# Patient Record
Sex: Female | Born: 1950 | Race: White | Hispanic: No | Marital: Married | Smoking: Never smoker
Health system: Southern US, Community
[De-identification: ages and names within clinical notes are randomized; demographics above are authoritative.]

## PROBLEM LIST (undated history)

## (undated) DIAGNOSIS — I313 Pericardial effusion (noninflammatory): Secondary | ICD-10-CM

## (undated) DIAGNOSIS — I319 Disease of pericardium, unspecified: Secondary | ICD-10-CM

## (undated) DIAGNOSIS — J309 Allergic rhinitis, unspecified: Secondary | ICD-10-CM

## (undated) DIAGNOSIS — M722 Plantar fascial fibromatosis: Secondary | ICD-10-CM

## (undated) DIAGNOSIS — N951 Menopausal and female climacteric states: Secondary | ICD-10-CM

## (undated) DIAGNOSIS — C801 Malignant (primary) neoplasm, unspecified: Secondary | ICD-10-CM

## (undated) DIAGNOSIS — Z923 Personal history of irradiation: Secondary | ICD-10-CM

## (undated) DIAGNOSIS — I3139 Other pericardial effusion (noninflammatory): Secondary | ICD-10-CM

## (undated) DIAGNOSIS — R51 Headache: Secondary | ICD-10-CM

## (undated) DIAGNOSIS — I514 Myocarditis, unspecified: Secondary | ICD-10-CM

## (undated) HISTORY — DX: Myocarditis, unspecified: I51.4

## (undated) HISTORY — PX: ABDOMINAL HYSTERECTOMY: SHX81

## (undated) HISTORY — PX: OTHER SURGICAL HISTORY: SHX169

## (undated) HISTORY — DX: Other pericardial effusion (noninflammatory): I31.39

## (undated) HISTORY — DX: Disease of pericardium, unspecified: I31.9

## (undated) HISTORY — DX: Menopausal and female climacteric states: N95.1

## (undated) HISTORY — DX: Pericardial effusion (noninflammatory): I31.3

## (undated) HISTORY — PX: COLONOSCOPY: SHX174

---

## 1999-05-30 ENCOUNTER — Other Ambulatory Visit: Admission: RE | Admit: 1999-05-30 | Discharge: 1999-05-30 | Payer: Self-pay | Admitting: Obstetrics and Gynecology

## 2001-03-24 ENCOUNTER — Encounter: Admission: RE | Admit: 2001-03-24 | Discharge: 2001-03-24 | Payer: Self-pay | Admitting: Urology

## 2001-03-24 ENCOUNTER — Encounter: Payer: Self-pay | Admitting: Urology

## 2001-03-31 ENCOUNTER — Encounter: Payer: Self-pay | Admitting: Urology

## 2001-03-31 ENCOUNTER — Encounter: Admission: RE | Admit: 2001-03-31 | Discharge: 2001-03-31 | Payer: Self-pay | Admitting: Urology

## 2002-08-25 ENCOUNTER — Other Ambulatory Visit: Admission: RE | Admit: 2002-08-25 | Discharge: 2002-08-25 | Payer: Self-pay | Admitting: Dermatology

## 2002-11-04 ENCOUNTER — Other Ambulatory Visit: Admission: RE | Admit: 2002-11-04 | Discharge: 2002-11-04 | Payer: Self-pay | Admitting: Obstetrics and Gynecology

## 2005-01-20 ENCOUNTER — Encounter: Admission: RE | Admit: 2005-01-20 | Discharge: 2005-01-20 | Payer: Self-pay | Admitting: Gastroenterology

## 2005-01-22 ENCOUNTER — Ambulatory Visit: Payer: Self-pay

## 2005-01-22 ENCOUNTER — Inpatient Hospital Stay (HOSPITAL_COMMUNITY): Admission: AD | Admit: 2005-01-22 | Discharge: 2005-02-01 | Payer: Self-pay | Admitting: Cardiology

## 2005-01-22 ENCOUNTER — Ambulatory Visit: Payer: Self-pay | Admitting: Cardiology

## 2005-01-23 ENCOUNTER — Ambulatory Visit: Payer: Self-pay | Admitting: Cardiology

## 2005-01-23 ENCOUNTER — Encounter (INDEPENDENT_AMBULATORY_CARE_PROVIDER_SITE_OTHER): Payer: Self-pay | Admitting: *Deleted

## 2005-01-24 ENCOUNTER — Encounter: Payer: Self-pay | Admitting: Cardiology

## 2005-01-24 ENCOUNTER — Ambulatory Visit: Payer: Self-pay | Admitting: Pulmonary Disease

## 2005-01-24 ENCOUNTER — Ambulatory Visit: Payer: Self-pay | Admitting: Cardiology

## 2005-01-25 ENCOUNTER — Encounter (INDEPENDENT_AMBULATORY_CARE_PROVIDER_SITE_OTHER): Payer: Self-pay | Admitting: *Deleted

## 2005-01-25 ENCOUNTER — Encounter (INDEPENDENT_AMBULATORY_CARE_PROVIDER_SITE_OTHER): Payer: Self-pay | Admitting: Specialist

## 2005-02-12 ENCOUNTER — Ambulatory Visit: Payer: Self-pay | Admitting: Cardiology

## 2005-02-25 ENCOUNTER — Encounter: Payer: Self-pay | Admitting: Cardiology

## 2005-02-25 ENCOUNTER — Ambulatory Visit: Payer: Self-pay

## 2005-02-25 ENCOUNTER — Ambulatory Visit: Payer: Self-pay | Admitting: Cardiology

## 2005-03-18 ENCOUNTER — Ambulatory Visit: Payer: Self-pay | Admitting: Cardiology

## 2005-05-06 ENCOUNTER — Ambulatory Visit: Payer: Self-pay | Admitting: Cardiology

## 2005-05-20 ENCOUNTER — Encounter: Payer: Self-pay | Admitting: Cardiology

## 2005-05-20 ENCOUNTER — Ambulatory Visit: Payer: Self-pay | Admitting: Cardiology

## 2005-05-20 ENCOUNTER — Inpatient Hospital Stay (HOSPITAL_COMMUNITY): Admission: EM | Admit: 2005-05-20 | Discharge: 2005-05-22 | Payer: Self-pay | Admitting: Emergency Medicine

## 2005-06-12 ENCOUNTER — Ambulatory Visit: Payer: Self-pay

## 2005-06-12 ENCOUNTER — Encounter: Payer: Self-pay | Admitting: Cardiology

## 2005-06-12 ENCOUNTER — Ambulatory Visit: Payer: Self-pay | Admitting: Cardiology

## 2005-07-23 ENCOUNTER — Ambulatory Visit: Payer: Self-pay | Admitting: Cardiology

## 2005-09-22 ENCOUNTER — Ambulatory Visit: Payer: Self-pay | Admitting: Cardiology

## 2005-09-26 ENCOUNTER — Ambulatory Visit: Payer: Self-pay | Admitting: Cardiology

## 2005-09-26 ENCOUNTER — Ambulatory Visit: Payer: Self-pay

## 2006-09-16 ENCOUNTER — Ambulatory Visit: Payer: Self-pay | Admitting: Cardiology

## 2008-01-28 ENCOUNTER — Encounter: Admission: RE | Admit: 2008-01-28 | Discharge: 2008-01-28 | Payer: Self-pay | Admitting: Obstetrics and Gynecology

## 2008-01-31 HISTORY — PX: BREAST BIOPSY: SHX20

## 2008-10-27 DIAGNOSIS — I319 Disease of pericardium, unspecified: Secondary | ICD-10-CM | POA: Insufficient documentation

## 2008-10-27 DIAGNOSIS — N951 Menopausal and female climacteric states: Secondary | ICD-10-CM

## 2008-10-27 DIAGNOSIS — Z79890 Hormone replacement therapy (postmenopausal): Secondary | ICD-10-CM

## 2008-10-27 DIAGNOSIS — I514 Myocarditis, unspecified: Secondary | ICD-10-CM | POA: Insufficient documentation

## 2008-10-30 ENCOUNTER — Ambulatory Visit (HOSPITAL_COMMUNITY): Admission: RE | Admit: 2008-10-30 | Discharge: 2008-10-30 | Payer: Self-pay | Admitting: Family Medicine

## 2008-11-02 ENCOUNTER — Ambulatory Visit: Payer: Self-pay | Admitting: Cardiology

## 2008-11-02 DIAGNOSIS — I300 Acute nonspecific idiopathic pericarditis: Secondary | ICD-10-CM | POA: Insufficient documentation

## 2008-11-02 DIAGNOSIS — R9431 Abnormal electrocardiogram [ECG] [EKG]: Secondary | ICD-10-CM | POA: Insufficient documentation

## 2008-11-13 ENCOUNTER — Encounter: Admission: RE | Admit: 2008-11-13 | Discharge: 2008-11-13 | Payer: Self-pay | Admitting: Obstetrics and Gynecology

## 2010-03-06 ENCOUNTER — Encounter
Admission: RE | Admit: 2010-03-06 | Discharge: 2010-03-06 | Payer: Self-pay | Source: Home / Self Care | Attending: Obstetrics and Gynecology | Admitting: Obstetrics and Gynecology

## 2010-07-30 NOTE — Assessment & Plan Note (Signed)
[facility]                            CARDIOLOGY OFFICE NOTE   NAME:[name], Angel SHADD                        MRN:          [id number]  DATE:09/16/2006                            DOB:          02-17-1951    [name] returns today for followup of her history of recurrent  idiopathic and inflammatory pericarditis.   She is doing remarkably well with no recurrences over the past year! She  had some chest discomfort and some jaw aching last year and we did a  stress Myoview. She did excellent exercising for 9 minutes and 16  seconds. Peak heart rate was 92% of predicted maximum heart rate, met  level achieved was 10.5. She had some general neck pain with the test  and her EF was 67% with no ischemia.   She has had no symptoms of recurrent pericarditis.   MEDICATIONS:  1. Premarin 0.625 mg p.o. daily.  2. Vitamin E daily.   PHYSICAL EXAMINATION:  VITAL SIGNS:  Her blood pressure is 131/82, her  pulse 71 and regular, weight is 131.  HEENT:  Normocephalic, atraumatic. PERRLA. Extraocular movements intact.  Sclera clear. Facial symmetry is normal. Carotids are full. There is no  JVD. Thyroid is not enlarged, trachea is midline.  HEART:  Reveals a nondisplaced PMI. She has normal S1, S2 without rub.  LUNGS:  Clear.  ABDOMEN:  Soft with good bowel sounds.  EXTREMITIES:  Reveal no edema. Pulses are intact.  NEUROLOGIC:  Intact.   Her electrocardiogram  shows normal sinus rhythm, left anterior  fascicular block with poor R wave progression across the anterior  precordium which is old.   ASSESSMENT/PLAN:  Ms. Engebretson is doing well. I am delighted she has had no  recurrence of her pericarditis. She went through a very difficult time.   I will plan on seeing her back in a year.     Thomas C. Daleen Squibb, MD, [facility]  Electronically Signed    TCW/MedQ  DD: 09/16/2006  DT: 09/17/2006  Job #: [id number]

## 2010-08-02 NOTE — H&P (Signed)
NAME:  KYLEIGHA, MARKERT                 ACCOUNT NO.:  [id number]   MEDICAL RECORD NO.:  [id number]          PATIENT TYPE:  INP   LOCATION:  2040                         FACILITY:  [facility]   PHYSICIAN:  Jesse Sans. Wall, M.D.   DATE OF BIRTH:  [birth year]   DATE OF ADMISSION:  01/22/2005  DATE OF DISCHARGE:                                HISTORY & PHYSICAL   REASON FOR ADMISSION:  I was asked by Dr. Ewing Schlein to see Raul Del today who  has cardiomegaly on chest x-ray and some pleural effusion on chest x-ray.   HISTORY OF PRESENT ILLNESS:  The patient is a delightful 60 year old very  healthy married [place] female who has had a 2-week history of progressive  problems with indigestion, difficulty swallowing, early satiety, abdominal  bloating, dry nonproductive cough, and now peripheral edema.   The insidiousness of this coincided with returning from a trip from [place].  She denies any history consistent with a viral syndrome in the last  month or so.  There is no history of TB or unusual infections.  She has had  a mammogram in the past year that was negative  There is no history of  breast cancer in her family.  She has no history of an immune deficiency  disorder or frequent infections.  She had trauma to her chest over a year  ago when she ran inadvertently into a piece of exercise equipment walking in  the dark in her bedroom.   She denies any fever, but has had a few chills.  She has had no night  sweats.  She denies any hemoptysis or change in bowel habits.  Her weight  has increased significantly because of fluid retention.  Prior to this, she  felt just fine.   PAST MEDICAL HISTORY:   ALLERGIES:  She is intolerant to PENICILLIN.  She has had no history of dye  reaction.   CURRENT MEDICATIONS:  1.  HCTZ 12.5 mg daily, which she was just put on yesterday by her primary      care physician for fluid.  2.  Premarin 0.625 mg p.o. daily.   SOCIAL HISTORY:  She drinks 1-2 cups of  coffee a day.  She does not smoke or  drink.  She has been extremely healthy.  She is very active.  She has had a  hysterectomy in 1991.  She is married.  She has 1 child.  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   FAMILY HISTORY:  Significant for father having an MI at age 67.  Sister at  age 59 had a stent.  She does not know her lipid status.   REVIEW OF SYSTEMS:  Other than in the HPI, significant for constipation here  recently.   PHYSICAL EXAMINATION:  VITAL SIGNS:  She is 5 feet, 3 inches.  She weighs  132 pounds.  She usually weighs 118.  Her blood pressure is 120/86, pulse 90  and regular.  She does not have palpable pulsus paradox.  Her feet are cool,  but pulses are intact.  Her pulsus paradox measures about 10-12 in the left  arm on several checks.  HEENT:  Sclerae are clear.  She is normocephalic and atraumatic.  Pupils  equal, round and responsive to light and accommodation.  Extraocular  movements are intact.  Dentition is normal.  NECK:  Carotid upstrokes are equal bilaterally without bruits.  There is  JVD.  She has an HJR.  I could not visualize any Kussmaul's sign.  Her  thyroid is not enlarged.  Trachea is midline.  CHEST:  Chest wall is unremarkable.  BREAST EXAM:  Not done.  HEART:  PMI was poorly appreciated.  She has a normal S1 and S2 without  gallop, rub, or pericardial knock.  LUNGS:  Remarkable for decreased breath sounds in the left base.  ABDOMEN:  Slightly distended with good bowel sounds.  Liver edge is 2  fingerbreadths below the right costal margin and slightly tender.  EXTREMITIES:  No cyanosis or clubbing, but there is 1+ to 2+ edema.  She  does have some dependent rubor, and her feet and toes were slightly cool.  NEUROLOGIC:  Intact.   ELECTROCARDIOGRAM:  Electrocardiogram shows sinus rhythm with low voltage  and minimal electrical alternans.  It is difficult to see because of low  voltage.   CHEST X-RAY:  A chest x-ray was obtained in  Dr. Marlane Hatcher office.  I do not  have a copy of that.   LABORATORY DATA:  Pending.   ECHOCARDIOGRAM:  Reveals a very large global pericardial effusion that is at  least 2 cm anteriorly and 3 cm at the apex.  There is some right ventricular  collapse.  There are some respirophasic changes that are difficult to  assess.  Left ventricular function and size were normal.  EF was 55% to 65%.  Inferior vena cava was mildly dilated.  There was no pertinent or  significant valve disease.   ASSESSMENT:  Subacute pericarditis with a large pericardial effusion.  The  most likely etiology is idiopathic, though there is no obvious viral  prodrome or syndrome.  Other causes of the very large effusions include  malignancy, particularly breast in women, though she had a negative  mammogram within the past year.  Other causes include tuberculosis, but  there is no history of this.   PLAN:  1.  Admit to telemetry.  2.  Schedule for pericardial centesis with either echo guidance or in the      catheterization lab tomorrow morning.  I would not proceed to a window      unless she has positive cytologies and/or if there is recurrence of      perfusion.  If she has a bloody pericardial effusion, then I would      proceed with a window, as well.  Hopefully, we can manage this      conservatively.  Fluid should be sent off for cytology, as well as all      cultures including tuberculosis and atypical infections.  3.  IV fluids at 125 an hour.  4.  Alert physician if her heart rate is greater than 110 or her blood      pressure decreases below 100 this evening.  5.  Sedimentation rate, CBC, comprehensive metabolic panel, TSH.  6.  PPD and control.  7.  Tussionex for cough which she is very uncomfortable with.  8.  Acquire chest x-ray.  9.  Chest CT to evaluate pericardium for any thickening mass or lung  pathology.     Thomas C. Wall, M.D.  Electronically Signed     TCW/MEDQ  D:  01/22/2005  T:   01/22/2005  Job:  [id number]   cc:   Petra Kuba, M.D.  Fax: [phone]   [facility]  [place], [place]

## 2010-08-02 NOTE — Consult Note (Signed)
[name], [name]                 ACCOUNT NO.:  [id number]   MEDICAL RECORD NO.:  [id number]          PATIENT TYPE:  INP   LOCATION:  2927                         FACILITY:  [facility]   PHYSICIAN:  Coralyn Helling, M.D.      DATE OF BIRTH:  03/22/1950   DATE OF CONSULTATION:  01/24/2005  DATE OF DISCHARGE:                                   CONSULTATION   REFERRING PHYSICIAN:  Willa Rough, M.D.   INDICATION FOR CONSULTATION:  Pleural effusion.   This is a very pleasant 60 year old female who was admitted January 22, 2005, with a pericardial effusion.  She said that she had not been feeling  well for the last several weeks.  She said this coincided with a recent trip  to [place] and upon returning from that, she started to notice her  symptoms.  She said that she would have a feeling like she could not expand  her lungs completely as well a feeling of nauseousness and feeling short of  breath.  She also had some vague abdominal pain.  She was seen by  gastroenterology for this, and there was concern that she had gallbladder  disease.  She had undergone an ultrasound of her abdomen January 20, 2005,  and this showed bilateral pleural effusions, a simple-appearing right  hepatic cyst but otherwise essentially normal upper abdominal ultrasound.  She had then undergone a chest x-ray on January 20, 2005, and this showed  marked cardiac enlargement with bilateral pleural effusions with bibasilar  atelectasis.  A CT of the chest January 22, 2005, showed a very large  pericardial effusion and bilateral pleural effusions, which was larger on  the right than the left, and dependent pulmonary atelectasis.  She  subsequently underwent right heart catheterization and pericardiocentesis,  and it demonstrated increased right atrial pressures prior to fluid drainage  and then she had approximately 1400 mL of bloody fluid removed from her  pericardial sac.  She said since then she has felt  symptomatically better,  but she still has difficulty as far as feeling like she cannot take a deep  breath.  She denies having any recent fevers.  There is no history of  hepatitis or jaundice.  She does not have any history of renal disease.  She  has not lost any weight recently.  She said that she did have an episode of  sweating last night but otherwise nothing from before.  She works as a  Runner, broadcasting/film/video and says she has sick contacts with the people she works with but  otherwise no exposure to tuberculosis.  She denies any symptoms of coughing,  sputum production or hemoptysis.  She also denies any skin rashes or joint  pain or stiffness.  She says that she did have some abdominal bloating as  well as leg swelling, but this seems to have improved.   PAST MEDICAL HISTORY:  Significant for endometriosis, and she is status post  abdominal hysterectomy with bilateral salpingo-oophorectomy for this.   MEDICATIONS:  1.  Premarin 0.625 mg daily.  2.  She was  recently started on hydrochlorothiazide.  3.  She takes vitamin E.   ALLERGIES:  She has an intolerance to penicillin.   SOCIAL HISTORY:  She drinks one to two cups of coffee a day.  Has no history  of smoking or alcohol use.  She is married and has one child.  She works as  a Librarian, academic in eighth grade.   FAMILY HISTORY:  Significant for father, who had an MI.  Sister had what  sounds like a congenital cardiac anomaly.   REVIEW OF SYSTEMS:  As stated above.   PHYSICAL EXAMINATION:  GENERAL:  She is a very pleasant-appearing female.  VITAL SIGNS:  Temperature 97.7, blood pressure is 100/75, heart rate is 100,  respiratory rate is 20, oxygenation is 96% on room air.  HEENT:  Pupils equal and reactive.  Extraocular muscles intact.  There is no  scleral icterus.  No sinus tenderness.  No oral lesions.  NECK:  There is no lymphadenopathy, no thyromegaly.  CARDIAC:  Decreased heart sounds, S1, S2.  CHEST:  Decreased breath  sounds at the bases with bilateral dullness to  percussion and rales midway up.  There is no wheezing.  ABDOMEN:  Soft, nontender, positive bowel sounds.  EXTREMITIES:  There is mild ankle edema, otherwise no cyanosis or clubbing.  NEUROLOGIC:  Alert and oriented x3.  5/5 strength.  No cerebellar deficits  noted.   LABORATORY DATA:  CBC showed WBC of 7.4, hemoglobin 16.5, hematocrit 46.1,  platelets of 330.  Sodium is 138, potassium is 3.4, chloride is 103, CO2 is  29, BUN is 4, creatinine 0.9, glucose is 134.  AST is 50, ALT is 53, ALP 65,  bilirubin is 2, albumin is 3.2, calcium is 8.5.  Pericardial fluid protein  is 4.9, the glucose is less than 20, creatinine is less than 0.7.  Amylase  was 42.  LDH was 918.  The pericardial fluid showed a bloody appearance with  3300 Legate cells, 19 neutrophils, 38 lymphocytes and 43 macrophages and no  eosinophils, and there were many pigment-laden macrophages and many  mesothelial cells were present as well.  The erythrocyte sedimentation rate  was 8.  TSH was 2.14.   IMPRESSION:  A 60 year old female with pericardial effusion and bilateral  pleural effusions and possible leg swelling. At this point I would like to  wait for the results of the pericardial fluid cytology.  I doubt that this  is an acute infectious etiology.  She has had a PPD placed in her right  forearm yesterday, and we will follow up the results of this although this  appears to be negative so far.  She does have a mild increase in her liver  enzymes, but this may be secondary to passive congestion from her  pericardial effusion.  Will repeat the liver enzymes.  I would also like to  check a C-reactive protein, ANA and rheumatoid factor as well as a CEA, CA19-  19 and CA125, and then depending upon the results of these and the  pericardial fluid results, I will determine the need for thoracentesis for further evaluation, but we will follow along with you.   As always, thank  you very much for allowing [place] to share in the care of your  patient.      Coralyn Helling, M.D.  Electronically Signed     VS/MEDQ  D:  01/24/2005  T:  01/25/2005  Job:  [id number]

## 2010-08-02 NOTE — Consult Note (Signed)
NAME:  [name], [name]                 ACCOUNT NO.:  [id number]   MEDICAL RECORD NO.:  [id number]          PATIENT TYPE:  INP   LOCATION:  2927                         FACILITY:  [facility]   PHYSICIAN:  Evelene Croon, M.D.     DATE OF BIRTH:  Nov 24, 1950   DATE OF CONSULTATION:  01/24/2005  DATE OF DISCHARGE:                                   CONSULTATION   CARDIOVASCULAR SURGICAL CONSULTATION   REASON FOR CONSULTATION:  Recurrent pericardial effusion.   HISTORY OF PRESENT ILLNESS:  The patient is a 60 year old previously healthy  woman who was admitted on 01/22/2005 with a 2-week history of progressive  complaints of indigestion, difficulty swallowing, early satiety, abdominal  bloating, nonproductive cough, shortness of breath, and peripheral edema.  She had an echocardiogram performed which showed a very large pericardial  effusion with signs of tamponade. Ejection fraction was 55-65%. On 11/09 she  underwent pericardiocentesis by Dr. Juanda Chance who removed 1400 mL of bloody  pericardial fluid. Right atrial pressure went from 24-12 and wedge pressure  went from 24-14. The catheter was not left in the pericardium. She noticed  improvement in her symptoms, but continued to have a nonproductive cough.  The cultures of this fluid are preliminarily negative. The fluid showed  bloody fluid with a Xiong blood cell count 33,000 with pigmented macrophage  and mesoepithelial cells.  Cytology is reportedly negative. A repeat  echocardiogram was performed today which showed partial reaccumulation of  the fluid with signs of early collapse of the right ventricle. I was  consulted by Dr. Gala Romney to evaluate the patient for pericardial window.   PAST MEDICAL HISTORY:  Significant for hysterectomy and 1991. She denies any  other medical illnesses.   SOCIAL HISTORY:  She is a nonsmoker; denies alcohol abuse. She is very  active. 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   MEDICATIONS PRIOR TO ADMISSION:  1.  HCTZ 12.5 mg q.d. which was just started due to edema.  2.  She is on Premarin 0.625 mg daily.   ALLERGIES:  PENICILLIN.   REVIEW OF SYSTEMS:  Her review of systems is as follows:  GENERAL:  She  denies fever or chills. She denies any night sweats. She has had recent  fatigue. EYES:  Negative. ENT:  Negative.  ENDOCRINE:  She denies diabetes  and hypothyroidism. CARDIOVASCULAR:  She had no chest pain until the  pericardial drainage procedure; and then she had some chest pain yesterday.  She has had recent exertional dyspnea over the past 2 weeks. She has had  some orthopnea. She has had mild peripheral edema. She denies palpitations.  RESPIRATORY:  She has had a dry nonproductive cough over the past 2 weeks.  She denies any upper respiratory infection. There is no history of TB.  GI:  She denies nausea or vomiting. She has had a indigestion; feeling like she  could not swallow her food and early satiety. She has had some abdominal  bloating over the past 2 weeks. She has had constipation. GU:  She  denies  dysuria and hematuria. PSYCHIATRIC:  Negative.   FAMILY HISTORY:  Her father had an MI at age 50; and her sister had a stent  at age 67.   PHYSICAL EXAMINATION:  VITAL SIGNS:  Blood pressure is 110/75 and her pulse  is monitored at 15 and regular. Respiratory rate is 60 and unlabored.  Oxygen saturations is 92% on room air.  She is a well-developed, [place]  female in no distress.  HEENT EXAM:  Shows her to be normocephalic and atraumatic. Pupils are equal  and reactive to light and accommodation. Extraocular muscles are intact. The  throat is clear.  NECK EXAM:  Shows normal carotid pulses bilaterally. There is a mild JVD.  There is no adenopathy or thyromegaly. Carotid pulses are palpable  bilaterally. There are no bruits.  CARDIAC EXAM:  Shows a regular rate and rhythm with distant heart sounds.  There is no murmur.  LUNGS:   Revealed decreased breath sounds in the lower lobes.  ABDOMINAL EXAM:  Shows active bowel sounds. The abdomen is soft and  nontender. There are no palpable masses or organomegaly.  EXTREMITY EXAM:  Shows no peripheral edema. Pedal pulses are palpable  bilaterally.  SKIN:  Warm and dry.  NEUROLOGIC EXAM:  Shows her to be alert and oriented x3. Motor and sensory  exams are grossly normal.   IMPRESSION:  Ms. Burgen has recurrence of pericardial effusion after  pericardicentesis and drainage of 14 mL of bloody pericardial fluid. She has  bilateral pleural effusions on CT scan. I agree that proceeding with  pericardial window to completely drain the pericardial effusion is the best  treatment to prevent recurrence; and we will leave a tube in for several  days. I will review her CT scan of the chest to decide whether a drainage of  the pericardial effusion should be done at the same time. I discussed the  operative procedure with her including alternatives, benefits, and risks,  including:  Bleeding, blood transfusion, infection, stroke, myocardial  infarction, and death. She understands and would like proceed with surgery.  We will plan to do this in the morning, 01/25/2005.      Evelene Croon, M.D.  Electronically Signed     BB/MEDQ  D:  01/24/2005  T:  01/26/2005  Job:  [id number]

## 2010-08-02 NOTE — H&P (Signed)
NAME:  [name], [name]                 ACCOUNT NO.:  [id number]   MEDICAL RECORD NO.:  [id number]          PATIENT TYPE:  INP   LOCATION:  1843                         FACILITY:  [facility]   PHYSICIAN:  Olga Millers, M.D. LHCDATE OF BIRTH:  05-Oct-1950   DATE OF ADMISSION:  05/20/2005  DATE OF DISCHARGE:                                HISTORY & PHYSICAL   PRIMARY CARDIOLOGIST:  Dr. Valera Castle   She does not have a primary care physician.   PATIENT PROFILE:  60 year old [place] female with prior history of  pericarditis, mild myocarditis, and status post pericardial window who  presents today with worsening chest pain.   PROBLEM LIST:  1.  Pericarditis.  2.  Had a history of pericardial effusion.      1.  Status post pericardial window January 25, 2005 by Dr. Laneta Simmers.      2.  February 25, 2005 repeat echocardiogram showing moderate loculated          fibrin-stranded pericardial effusion anterior to the heart.  There          is also a left pleural effusion.  Ejection fraction was 55-65%.  3.  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  4.  Perimenopausal.  5.  Status post hysterectomy 1991.   HISTORY OF PRESENT ILLNESS:  60 year old [name] female with history of  pericarditis in November 2007 with significant dyspnea, edema, and  echocardiogram revealing large pericardial effusion measuring 3 cm at the  apex and 2.5 cm anteriorly.  Dr. Laneta Simmers performed pericardial window and she  was discharged February 01, 2005.  All cultures and cytology were negative.  Since discharge she has continued to have pericarditic pain with  echocardiogram in December of 2006 showing loculated fibrin-stranded  moderate anterior pericardial effusion despite Naproxen therapy prompting  steroid taper for five weeks between December of 2006 and January of 2006.  She always did well on steroids but when the taper got down to the 5 mg dose  she would have breakthrough pain.  She was seen by Dr. Daleen Squibb on February  20  with additional complaints of pain and was placed on colchicine.  Since  March 3 she has had worse chest pain than normal with nearly every breath or  with lying flat and relieved by sitting forward.  She has also had chest  pain with exertion.  She denies any PND, orthopnea, dizziness, syncope,  edema, or early satiety.  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   ALLERGIES:  PENICILLIN.   MEDICATIONS:  1.  Toprol XL 50 mg b.i.d.  2.  Colchicine 0.6 mg b.i.d. started on May 06, 2005.   FAMILY HISTORY:  Mother is alive and well at age 26.  Father died at 3 of  an MI.  He also had an MI at age 17.  She has a sister who has a history of  CAD and was stented at age 69.   SOCIAL HISTORY:  She lives in [place] with her husband.  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  She is married and has one child.  She denies any  tobacco, alcohol, or drug use.  She does not routinely exercise.   REVIEW OF SYSTEMS:  Positive for nocturnal sweats, chest pain, some dyspnea  on exertion.  All other systems reviewed and negative.   PHYSICAL EXAMINATION:  VITAL SIGNS:  Temperature 98.3, heart rate 104,  respirations 24, blood pressure 125/66.  GENERAL:  Pleasant [place] female in no acute distress.  Awake, alert, and  oriented x3.  NECK:  Normal carotid upstrokes.  No bruits, JVD.  LUNGS:  Respirations regular, unlabored.  Clear to auscultation.  CARDIAC:  Regular S1, S2.  She does have a gallop.  There are no murmurs or  rub.  ABDOMEN:  Round, soft, nontender, nondistended.  Bowel sounds present x4.  EXTREMITIES:  Warm, dry, pink.  No clubbing, cyanosis, edema.  Dorsalis  pedis, posterior tibial pulses 2+ and equal bilaterally.   Chest x-ray is pending.  EKG shows a rate of 105 in sinus tachycardia with a  left axis and no ST-T changes.  All laboratory work is pending.    ASSESSMENT/PLAN:  Subacute pericarditis.  Patient has had difficulty with  chest pain since discharge in November 2006.  Will check an echocardiogram  here in the emergency department to rule out pericardial effusion/tamponade.  Vital signs stable and I do not appreciate a rub, although she does have a  gallop.  Her previous evaluation included pericardiocentesis revealing no  malignancy or infection and findings were consistent with  pericarditis/inflammation.  We will plan to admit her and follow with  echocardiogram results.  Will initiate a steroid taper as well as Naproxen  and provide gastrointestinal prophylaxis.  Will continue her colchicine.  Will also evaluate cardiac markers to rule out myocardial infarction.      Ok Anis, NP    ______________________________  Olga Millers, M.D. [facility]    CRB/MEDQ  D:  05/20/2005  T:  05/20/2005  Job:  [id number]

## 2010-08-02 NOTE — Discharge Summary (Signed)
NAME:  [name], [name]                 ACCOUNT NO.:  [id number]   MEDICAL RECORD NO.:  [id number]          PATIENT TYPE:  INP   LOCATION:  2016                         FACILITY:  [facility]   PHYSICIAN:  Jesse Sans. Wall, M.D.   DATE OF BIRTH:  21-Mar-1950   DATE OF ADMISSION:  01/22/2005  DATE OF DISCHARGE:  02/01/2005                                 DISCHARGE SUMMARY   PRIMARY CARDIOLOGIST:  Maisie Fus C. Wall, M.D.   PRINCIPAL DIAGNOSIS:  Subcu pericarditis with large pericardial effusion.   OTHER DIAGNOSES:  Premenopausal.   ALLERGIES:  PENICILLIN.   PROCEDURES:  Pericardiocentesis with placement of window.   HISTORY OF PRESENT ILLNESS:  A 60 year old married [place] female who, 2 weeks  prior to admission, began to experience progressive problems with  indigestion, difficulty swallowing, early satiety, abdominal bloating, dry  nonproductive cough, and peripheral edema. She saw her primary care  physician the day prior to admission and was  initiated on HCTZ for lower extremity edema. Secondary to worsening dyspnea  presented to the ER on January 22, 2005. Chest x-ray showed cardiomegaly  with some pleural effusion. She was admitted to cardiology for further  evaluation.   HOSPITAL COURSE:  Echocardiogram was performed revealing a large global  pericardial effusion measuring 3 cm at the apex and 2.5 cm anteriorly. She  did not demonstrate tamponade physiology and had normal LV function. She did  have RV collapse. LV function was normal. A right heart cardiac  catheterization and subxiphoid pericardial tap was performed on November 9.  Initially RA was 24, down to 12 following tap. Her wedge was 24, down to 14  following tap. They were able to draw 1400 mL of bloody fluid. On November  10 a repeat echocardiogram revealed recurrent effusion and CVTS was  consulted. She underwent placement of pericardial window on November 11 with  300 mL of serosanguineous fluid removed and sent for cytology.  Cytology was  negative for tumor and cultures were negative. She also had a PPD which was  negative x48 hours. Pericardial biopsy revealed acute inflammation  consistent with pericarditis without features of malignancy seen. Her chest  tube was discontinued and she was initiated on NSAIDs. She was transferred  out to telemetry and clinically was much improved. She was discharged home  on Naprosyn on November 18 in satisfactory condition.   DISCHARGE LABS:  Hemoglobin 13.0, hematocrit 38.6, WBCs 6.5, platelets 346.  Sodium 135, potassium 5.1, chloride 109, CO2 19, BUN 6, creatinine 0.8,  glucose 115, calcium 7.6. Total bilirubin 1.1, alkaline phosphatase 56, AST  24, ALT 28. CK 332, MB 6.3, troponin I of 0.06, TSH of 2.141, CEA less than  0.05. CA-125 antigen 267.2. CA19-9 at 9.8. High sensitivity CRP 98.5.   DISPOSITION:  The patient was discharged home in good condition.   FOLLOW UP APPOINTMENT:  She was asked to follow up with Dr. Valera Castle in  approximately 2 weeks following discharge.   DISCHARGE MEDICATIONS:  1.  Naproxen 500 mg b.i.d. x10 days.  2.  Protonix 40 mg daily x10 day.  3.  Toprol-XL 50 mg b.i.d.   __________. Duration of discharge __________ 30 minutes.      Ok Anis, NP      Jesse Sans. Wall, M.D.  Electronically Signed    CRB/MEDQ  D:  04/24/2005  T:  04/25/2005  Job:  [id number]   cc:   Thomas C. Wall, M.D.  [address]  [address]  [place]  [place] [address]

## 2010-08-02 NOTE — Op Note (Signed)
[name], [name]                 ACCOUNT NO.:  [id number]   MEDICAL RECORD NO.:  [id number]          PATIENT TYPE:  INP   LOCATION:  2927                         FACILITY:  [facility]   PHYSICIAN:  Evelene Croon, M.D.     DATE OF BIRTH:  11-Feb-1951   DATE OF PROCEDURE:  01/25/2005  DATE OF DISCHARGE:                                 OPERATIVE REPORT   PREOPERATIVE DIAGNOSES:  1.  Recurrent large pericardial effusion; and,  2.  Bilateral pleural effusion.   POSTOPERATIVE DIAGNOSES:  1.  Recurrent large pericardial effusion; and,  2.  Bilateral pleural effusion.   OPERATION PERFORMED:  1.  Subxiphoid pericardial window with drainage of pericardial effusion.  2.  Insertion of bilateral pleural chest tubes with drainage of pleural      effusion,   SURGEON:  Evelene Croon, M.D.   ANESTHESIA:  General endotracheal with 0.25% Marcaine local anesthesia.   CLINICAL HISTORY:  This patient is a 60 year old previously healthy woman  who was admitted on the cardiology service on January 22, 2005 with a large  pericardial effusion with tamponade and bilateral pleural effusion.  She had  a pericardiocentesis performed by Dr. Juanda Chance on January 23, 2005 and 1,400  mL of bloody pericardial was withdrawn.  As of this time cultures have been  negative and cytology was negative.  She had a repeat echocardiogram, which  showed partial reaccumulation of the pericardial effusion, with early  collapse of the right ventricle.  She remained hemodynamically stable,but  had some resting tachycardia.  She continued to have some shortness of  breath and cough related to the pleural effusions.  It was felt that a  pericardial window would be the best treatment for complete drainage of the  pericardial effusion as well as pericardial biopsy.  I also felt that the  drainage of both pleural effusions old be performed.   I discussed the operative procedure of subxiphoid pericardial window and  bilateral chest  tube insertion with the patient and her family including  alternatives, benefits and risks including bleeding, injury to the heart or  lungs, and recurrence of the pleural or pericardial effusions.  She  understood and agreed to proceed.   DESCRIPTION OF OPERATION:  The patient was brought to the operating room and  placed on the table in the supine position.  After the induction of general  endotracheal anesthesia a Foley catheter was placed in the bladder using a  sterile technique.  Then the chest was prepped with Betadine  soap  and  solution, and draped in the usual sterile manner.  Preoperative intravenous  Avelox was given.   Then a vertical midline incision was made over the xiphoid process and  carried down through the subcutaneous tissues using electrocautery.  The  fascia was divided in the midline using electrocautery.  The subxiphoid  space was entered.  The pericardium was identified and an opening made.  Three-hundred milliliters of serosanguineous pericardial fluid was withdrawn  and sent for cytology.  A window was made in the pericardium and the  specimen sent for pathological examination.  Examination of the heart through the small window showed some shaggy  exudates on the heart.  Then 28  French right-angle chest tube was brought  through a separate stab incision and positioned in the posterior  pericardium.   The fascia was anesthetized with a quarter percent Marcaine local  anesthesia.  The fascia was then reapproximated with interrupted 0-Vicryl  sutures, subcutaneous tissue was closed with continuous 2-0 Vicryl and the  skin with 3-0 Vicryl subcuticular closure.   COUNTS:  The sponge, needle and instrument counts were correct according to  the scrub nurse.   DRAINS:  Then 28 French chest tubs were inserted in both pleural spaces  through a small incision in the anterior axillary line.  Approximately 1,000  mL of yellow serous pleural fluid was removed from  the right pleural space  and sent for cytology.  About 500 mL of left pleural fluid was removed and  this was also yellow serous fluid.  These chest tubes were connected to  Pleur-Evac suction,   The patient remained hemodynamically stable. She was awakened, extubated and  transported to the post-anesthesia care unit in satisfactory and stable  condition.      Evelene Croon, M.D.  Electronically Signed     BB/MEDQ  D:  01/25/2005  T:  01/26/2005  Job:  [id number]   cc:   [facility]

## 2010-08-02 NOTE — Discharge Summary (Signed)
NAME:  [name], [name]                 ACCOUNT NO.:  [id number]   MEDICAL RECORD NO.:  [id number]          PATIENT TYPE:  INP   LOCATION:  2038                         FACILITY:  [facility]   PHYSICIAN:  Cecil Cranker, M.D.DATE OF BIRTH:  02-12-1951   DATE OF ADMISSION:  05/20/2005  DATE OF DISCHARGE:  05/22/2005                                 DISCHARGE SUMMARY   CARDIOLOGIST:  Maisie Fus C. Wall, M.D.   DISCHARGING CARDIOLOGIST:  Cecil Cranker, M.D.   PRIMARY CARE PHYSICIAN:  The patient does not have a primary care physician.   PRINCIPAL DIAGNOSES:  1.  Pericarditis.  2.  History of pericardial effusion.      1.  Status post pericardial window on January 25, 2005, by Dr. Laneta Simmers.      2.  On February 25, 2005, repeat echocardiogram showing moderate          loculated fibrin-stranded pericardial effusion anterior to his          heart. There is also a left pleural effusion. Ejection fraction was          55% to 65%.   SECONDARY DIAGNOSES:  1.  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  2.  Perimenopausal.  3.  Status post hysterectomy in 1991.   PROCEDURE PERFORMED DURING THIS HOSPITALIZATION:  A 2-D echo on May 20, 2005.   ALLERGIES TO MEDICATION:  PENICILLIN.   HISTORY OF PRESENT ILLNESS:  This is a 60 year old Caucasian female with a  history of pericarditis in November 2007 who had presented with significant  dyspnea, edema, and an echocardiogram revealing a large pericardial effusion  measuring 3 cm of the apex and 2.5 cm anteriorly. Previously Dr. Laneta Simmers had  performed a pericardial window and she was discharged on February 01, 2005.  Since her discharge she had continued to have pericardiac pain and an  echocardiogram in December 2006 showed a loculated fibrin-stranded, moderate  anterior pericardial effusion despite naproxen therapy. The patient was  started on a steroid taper for five weeks between December 2006 and January  2007. She did very well on  steroids, but when her pain got down to 5 mg she  would start to have breakthrough pain. She was seen in the office on  February 20th with additional complaints of pain and was placed on  colchicine. The patient was starting to complain of worse chest pain with  every breath and sitting forward. The patient came in through the ED and was  admitted for evaluation of this chest pain. The patient was admitted on  May 20, 2005, and 2-D echo was performed which showed left ventricular  systolic function normal. No ventricular regional wall abnormalities. There  was mild to moderate right ventricular hypertrophy, estimated peak right  ventricular systolic pressure was within the upper limits of normal. There  was a very small pericardial effusion predominately posterior to the heart.  The patient was seen by Dr. Corinda Gubler on May 22, 2005. The patient was  feeling well and Dr. Corinda Gubler stated the patient was stable to be discharged  home.  DISCHARGE DIAGNOSIS:  Pericarditis which was improving with a small  effusion.   DISCHARGE MEDICATIONS:  The patient will be sent home on the following  medications:  1.  Metoprolol succinate 50 mg b.i.d.  2.  Colchicine 0.6 mg b.i.d.  3.  Naproxen 500 mg b.i.d.  4.  Protonix 40 mg daily.  5.  Prednisone taper. The patient had completed 40 mg for three days, then      30 mg for three days, then 20 mg for two days, and then 10 mg for two      days.   The patient will follow up with Dr. Daleen Squibb on March 29th at 2:15. She will  have an echo at 1:00 prior to her visit. She was sent home on a heart  healthy diet.   Her discharge laboratories showed a Azure blood cell count of 6.2,  hemoglobin 10.9, hematocrit 32.1, platelet count 266,000. INR of 1.0. Sodium  135, potassium 3.6, chloride 103, glucose 88, BUN 7, and creatinine of 0.8.  CK-MBs were both 0.2 times two and BNP was 63.1.     ______________________________  April Humphrey, NP     ______________________________  E. Graceann Congress, M.D.    AH/MEDQ  D:  05/22/2005  T:  05/22/2005  Job:  [id number]

## 2010-08-02 NOTE — Cardiovascular Report (Signed)
NAME:  [name], [name]                 ACCOUNT NO.:  [id number]   MEDICAL RECORD NO.:  [id number]          PATIENT TYPE:  INP   LOCATION:  2040                         FACILITY:  [facility]   PHYSICIAN:  Charlies Constable, M.D. [facility] DATE OF BIRTH:  10-Feb-1951   DATE OF PROCEDURE:  01/23/2005  DATE OF DISCHARGE:                              CARDIAC CATHETERIZATION   PROCEDURE:  Right heart catheterization and pericardiocentesis.   CLINICAL HISTORY:  [name] is 60 years old and has no prior history of  known heart disease.  She has had symptoms of shortness of breath and  fatigue and some chest discomfort for about three weeks and was being  evaluated for potential GI problems and was referred by Dr. Ewing Schlein to Dr.  Daleen Squibb.  An echocardiogram was performed, which showed a large pericardial  effusion.  A decision was made to drain the effusion with a catheter today.   PROCEDURE:  Right heart catheterization was performed via the right femoral  vein using a mini-sheath and Swan-Ganz thermodilution catheter.  The  subxiphoid area was prepped and draped.  We used an echo under sterile  conditions to document the access via the subxiphoid approach to the  pericardial effusion.  We documented that an angle just to the left side of  the neck would be optimal.  The skin and subcutaneous tissue were  anesthetized with 1% local Xylocaine.  Using an 18-gauge needle and ECG  guidance, we passed the needle into the pericardial space.  We aspirated  bloody fluid.  We then passed a wire into the pericardial space and  documented that the position of the wire appeared to be in the pericardium  rather than intracardiac.  We then used a 5 [place] dilator to dilate the  tract into the pericardium and then followed this with a pericardial  catheter.  We hooked the catheter to pressure and documented that this was a  pericardial pressure.  We then drained about 1400 mL of bloody fluid from  the pericardium.  She was mildly  short of breath at the beginning of the  procedure, and these symptoms improved after drainage.  The pericardial  catheter was removed at the end of the catheter.  The right heart  catheterization was pulled back and removed at the end of the procedure.  The patient tolerated the procedure well and left the laboratory in  satisfactory condition.   RESULTS:  Initially the pericardial pressure was 24 and following  pericardiocentesis, this fell to 0.  The right atrial pressure was 24 and  fell to 12.  Pulmonary artery pressure was 38/21 with a mean of 30.  Pulmonary wedge pressure was initially 24 and fell to 14.   CONCLUSION:  Pericardiocentesis with drainage of 1400 mL of bloody fluid  with a fall in the right atrial pressure from 24 to 12 and a fall in the  pericardial pressure from 24 to 0.   DISPOSITION:  The patient returned to coronary intensive care unit.  All the  fluid was sent for cytology, cultures and chemistries.  ______________________________  Charlies Constable, M.D. [facility]     BB/MEDQ  D:  01/23/2005  T:  01/24/2005  Job:  [id number]   cc:   Thomas C. Wall, M.D.  [address]  [address]  [place]  [place] [address]   Willa Rough, M.D.  [address]  [address]  [place]  [place] [address]   Cardiopulmonary Lab

## 2010-12-09 ENCOUNTER — Encounter: Payer: Self-pay | Admitting: Cardiology

## 2010-12-10 ENCOUNTER — Encounter: Payer: Self-pay | Admitting: Cardiology

## 2010-12-10 ENCOUNTER — Ambulatory Visit (INDEPENDENT_AMBULATORY_CARE_PROVIDER_SITE_OTHER): Payer: BC Managed Care – PPO | Admitting: Cardiology

## 2010-12-10 VITALS — BP 115/80 | HR 70 | Ht 62.0 in | Wt 128.0 lb

## 2010-12-10 DIAGNOSIS — I318 Other specified diseases of pericardium: Secondary | ICD-10-CM

## 2010-12-10 DIAGNOSIS — I319 Disease of pericardium, unspecified: Secondary | ICD-10-CM | POA: Insufficient documentation

## 2010-12-10 DIAGNOSIS — Z8679 Personal history of other diseases of the circulatory system: Secondary | ICD-10-CM | POA: Insufficient documentation

## 2010-12-10 MED ORDER — PREDNISONE 10 MG PO TABS: 10.0000 mg | ORAL_TABLET | ORAL | Status: DC | PRN

## 2010-12-10 NOTE — Progress Notes (Signed)
HPI [name] returns today for her history of idiopathic pericarditis and chronic recurrent pericarditis. She's not had an episode since this past year. 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 She denies any chest pain, angina, dyspnea on exertion, or palpitations. He does have some lower extremity edema hydrochlorothiazide keeps this in check.  She likely new on a prednisone pack.  EKG shows normal sinus rhythm left axis deviation no ST segment changes. Past Medical History  Diagnosis Date  . Perimenopausal   . Myocarditis, unspecified   . Unspecified disease of pericardium   . Pericardial effusion   . Pericarditis     Past Surgical History  Procedure Date  . Pericardial window on november 11,2006, by dr bartle   . Other surgical history     post hysterectomy 1991    Family History  Problem Relation Age of Onset  . Coronary artery disease Sister     stented at age 31    History   Social History  . Marital Status: Married    Spouse Name: N/A    Number of Children: N/A  . Years of Education: N/A   Occupational History  . Not on file.   Social History Main Topics  . Smoking status: Not on file  . Smokeless tobacco: Not on file  . Alcohol Use:   . Drug Use:   . Sexually Active:    Other Topics Concern  . Not on file   Social History Narrative   She lives in [place] with her husband. She teaches 8 th grade social studies .She is married and has one child. She denies any  Tobacco, alcohol, or drug use. She does not routinely exercise    Allergies  Allergen Reactions  . Penicillins     Current Outpatient Prescriptions  Medication Sig Dispense Refill  . Calcium Carb-Cholecalciferol (CALCIUM 500 +D PO) Take by mouth. 1 tab daily       . estrogens, conjugated, (PREMARIN) 0.625 MG tablet Take 0.625 mg by mouth daily.        . hydrochlorothiazide (HYDRODIURIL) 25 MG tablet Take 25 mg by mouth daily.        . Omega-3 Fatty Acids (FISH OIL  BURP-LESS PO) Take 1 tablet by mouth daily.        . predniSONE (DELTASONE) 10 MG tablet Take 1 tablet (10 mg total) by mouth as needed.  30 tablet  2    ROS Negative other than HPI.   PE General Appearance: well developed, well nourished in no acute distress HEENT: symmetrical face, PERRLA, good dentition  Neck: no JVD, thyromegaly, or adenopathy, trachea midline Chest: symmetric without deformity Cardiac: PMI non-displaced, RRR, normal S1, S2, no gallop or murmur Lung: clear to ausculation and percussion Vascular: all pulses full without bruits  Abdominal: nondistended, nontender, good bowel sounds, no HSM, no bruits Extremities: no cyanosis, clubbing or edema, no sign of DVT, no varicosities  Skin: normal color, no rashes Neuro: alert and oriented x 3, non-focal Pysch: normal affect Filed Vitals:   12/10/10 1012  BP: 115/80  Pulse: 70  Height: 5\' 2"  (1.575 m)  Weight: 128 lb (58.06 kg)    EKG  Labs and Studies Reviewed.   No results found for this basename: WBC, HGB, HCT, MCV, PLT      Chemistry   No results found for this basename: NA, K, CL, CO2, BUN, CREATININE, GLU   No results found for this basename: CALCIUM, ALKPHOS, AST,  ALT, BILITOT       No results found for this basename: CHOL   No results found for this basename: HDL   No results found for this basename: LDLCALC   No results found for this basename: TRIG   No results found for this basename: CHOLHDL   No results found for this basename: HGBA1C   No results found for this basename: ALT, AST, GGT, ALKPHOS, BILITOT   No results found for this basename: TSH

## 2010-12-10 NOTE — Patient Instructions (Addendum)
Your physician recommends that you schedule a follow-up appointment in: 1 year with Dr. Wall  

## 2011-07-01 ENCOUNTER — Encounter (HOSPITAL_COMMUNITY): Payer: Self-pay | Admitting: *Deleted

## 2011-07-01 ENCOUNTER — Emergency Department (HOSPITAL_COMMUNITY): Payer: BC Managed Care – PPO

## 2011-07-01 ENCOUNTER — Inpatient Hospital Stay (HOSPITAL_COMMUNITY)
Admission: EM | Admit: 2011-07-01 | Discharge: 2011-07-04 | DRG: 541 | Disposition: A | Discharge status: Home / Self Care | Payer: BC Managed Care – PPO | Source: Ambulatory Visit | Attending: Surgery | Admitting: Surgery

## 2011-07-01 DIAGNOSIS — S2241XA Multiple fractures of ribs, right side, initial encounter for closed fracture: Secondary | ICD-10-CM | POA: Diagnosis present

## 2011-07-01 DIAGNOSIS — J90 Pleural effusion, not elsewhere classified: Secondary | ICD-10-CM | POA: Diagnosis not present

## 2011-07-01 DIAGNOSIS — I319 Disease of pericardium, unspecified: Secondary | ICD-10-CM | POA: Diagnosis present

## 2011-07-01 DIAGNOSIS — S42023A Displaced fracture of shaft of unspecified clavicle, initial encounter for closed fracture: Secondary | ICD-10-CM | POA: Diagnosis present

## 2011-07-01 DIAGNOSIS — Y9352 Activity, horseback riding: Secondary | ICD-10-CM

## 2011-07-01 DIAGNOSIS — S42001A Fracture of unspecified part of right clavicle, initial encounter for closed fracture: Secondary | ICD-10-CM | POA: Diagnosis present

## 2011-07-01 DIAGNOSIS — S2249XA Multiple fractures of ribs, unspecified side, initial encounter for closed fracture: Principal | ICD-10-CM | POA: Diagnosis present

## 2011-07-01 DIAGNOSIS — S270XXA Traumatic pneumothorax, initial encounter: Secondary | ICD-10-CM | POA: Diagnosis present

## 2011-07-01 DIAGNOSIS — J939 Pneumothorax, unspecified: Secondary | ICD-10-CM

## 2011-07-01 HISTORY — DX: Headache: R51

## 2011-07-01 HISTORY — DX: Plantar fascial fibromatosis: M72.2

## 2011-07-01 HISTORY — DX: Allergic rhinitis, unspecified: J30.9

## 2011-07-01 LAB — DIFFERENTIAL
Eosinophils Relative: 0 % (ref 0–5)
Lymphocytes Relative: 5 % — ABNORMAL LOW (ref 12–46)
Lymphs Abs: 0.6 10*3/uL — ABNORMAL LOW (ref 0.7–4.0)
Monocytes Absolute: 0.6 10*3/uL (ref 0.1–1.0)
Monocytes Relative: 5 % (ref 3–12)
Neutro Abs: 10.2 10*3/uL — ABNORMAL HIGH (ref 1.7–7.7)

## 2011-07-01 LAB — CBC
HCT: 35.5 % — ABNORMAL LOW (ref 36.0–46.0)
Hemoglobin: 11.9 g/dL — ABNORMAL LOW (ref 12.0–15.0)
MCH: 31 pg (ref 26.0–34.0)
MCHC: 33.5 g/dL (ref 30.0–36.0)
MCV: 92.4 fL (ref 78.0–100.0)
Platelets: 213 10*3/uL (ref 150–400)
RBC: 3.84 MIL/uL — ABNORMAL LOW (ref 3.87–5.11)
RDW: 12.7 % (ref 11.5–15.5)
WBC: 11.3 10*3/uL — ABNORMAL HIGH (ref 4.0–10.5)

## 2011-07-01 LAB — BASIC METABOLIC PANEL WITH GFR
BUN: 16 mg/dL (ref 6–23)
CO2: 27 meq/L (ref 19–32)
Calcium: 9.1 mg/dL (ref 8.4–10.5)
Chloride: 102 meq/L (ref 96–112)
Creatinine, Ser: 0.78 mg/dL (ref 0.50–1.10)
GFR calc Af Amer: >90 mL/min
GFR calc non Af Amer: 90 mL/min — ABNORMAL LOW
Glucose, Bld: 104 mg/dL — ABNORMAL HIGH (ref 70–99)
Potassium: 3.7 meq/L (ref 3.5–5.1)
Sodium: 137 meq/L (ref 135–145)

## 2011-07-01 MED ORDER — ONDANSETRON HCL 4 MG/2ML IJ SOLN
4.0000 mg | Freq: Once | INTRAMUSCULAR | Status: AC
Start: 2011-07-01 — End: 2011-07-01
  Administered 2011-07-01: 4 mg via INTRAVENOUS
  Filled 2011-07-01: qty 2

## 2011-07-01 MED ORDER — HYDROMORPHONE HCL PF 1 MG/ML IJ SOLN
1.0000 mg | Freq: Once | INTRAMUSCULAR | Status: AC
  Administered 2011-07-01: 1 mg via INTRAVENOUS
  Filled 2011-07-01: qty 1

## 2011-07-01 MED ORDER — ONDANSETRON HCL 4 MG/2ML IJ SOLN
4.0000 mg | Freq: Once | INTRAMUSCULAR | Status: AC
  Administered 2011-07-01: 4 mg via INTRAVENOUS
  Filled 2011-07-01: qty 2

## 2011-07-01 MED ORDER — HYDROMORPHONE HCL PF 1 MG/ML IJ SOLN
1.0000 mg | Freq: Once | INTRAMUSCULAR | Status: AC
  Administered 2011-07-01: 1 mg via INTRAVENOUS

## 2011-07-01 MED ORDER — HYDROMORPHONE HCL PF 1 MG/ML IJ SOLN
INTRAMUSCULAR | Status: AC
  Filled 2011-07-01: qty 1

## 2011-07-01 NOTE — ED Provider Notes (Addendum)
History  This chart was scribed for Angel Cooper III, MD by Bennett Scrape. This patient was seen in room APA09/APA09 and the patient's care was started at 8:07PM.  CSN: [id number]  Arrival date & time 07/01/11  1955   First MD Initiated Contact with Patient 07/01/11 2004      Chief Complaint  Patient presents with  . Fall  . Clavicle Injury     The history is provided by the patient. No language interpreter was used.    [name] is a 61 y.o. female who presents to the Emergency Department complaining of a fall with sudden onset right clavicle and rib pain that occurred approximately 30 minutes PTA. Pt states that she was riding a horse when the horse turned suddenly and she fell off landing on her right side. She states that she wasn't wearing a helmet but she denies LOC or head trauma. She states that the pain is worse with movement of the RUE and better with rest. She currently has the RUE immobilized in a sling. She has not taken any medications PTA to improve symptoms. She denies having any other injuries or illnesses at this time. She denies fever, chills, sore throat, neck pain, back pain, chest pain, SOB, abdominal pain, nausea, HA, dysuria and confusion as associated symptoms. She has a h/o pericarditis. She denies smoking and alcohol use.  Past Medical History  Diagnosis Date  . Perimenopausal   . Myocarditis, unspecified   . Unspecified disease of pericardium   . Pericardial effusion   . Pericarditis     Past Surgical History  Procedure Date  . Pericardial window on november 11,2006, by dr bartle   . Other surgical history     post hysterectomy 1991    Family History  Problem Relation Age of Onset  . Coronary artery disease Sister     stented at age 7    History  Substance Use Topics  . Smoking status: Never Smoker   . Smokeless tobacco: Not on file  . Alcohol Use: Not on file    Review of Systems  A complete 10 system review of systems was  obtained and all systems are negative except as noted in the HPI and PMH.   Allergies  Penicillins  Home Medications   Current Outpatient Rx  Name Route Sig Dispense Refill  . CALCIUM 500 +D PO Oral Take by mouth. 1 tab daily     . ESTROGENS CONJUGATED 0.625 MG PO TABS Oral Take 0.625 mg by mouth daily.      Marland Kitchen HYDROCHLOROTHIAZIDE 25 MG PO TABS Oral Take 25 mg by mouth daily.      Marland Kitchen FISH OIL BURP-LESS PO Oral Take 1 tablet by mouth daily.      Marland Kitchen PREDNISONE 10 MG PO TABS Oral Take 1 tablet (10 mg total) by mouth as needed. 30 tablet 2    Triage Vitals: BP 120/58  Pulse 57  Temp(Src) 98.1 F (36.7 C) (Oral)  Ht 5\' 3"  (1.6 m)  Wt 128 lb (58.06 kg)  BMI 22.67 kg/m2  SpO2 99%  Physical Exam  Nursing note and vitals reviewed. Constitutional: She is oriented to person, place, and time. She appears well-developed and well-nourished.  HENT:  Head: Normocephalic and atraumatic.  Eyes: Conjunctivae and EOM are normal.  Neck: Normal range of motion. Neck supple.  Cardiovascular: Normal rate, regular rhythm and normal heart sounds.   Pulmonary/Chest: Effort normal and breath sounds normal. No respiratory distress.  Abdominal: Soft.  Bowel sounds are normal. There is no tenderness.  Musculoskeletal: She exhibits tenderness. She exhibits no edema.       Pt has a deformity to the right clavicle, decreased ROM at the right shoulder due to pain, tenderness along the right clavicle and rib area  Neurological: She is alert and oriented to person, place, and time.  Skin: Skin is warm and dry.  Psychiatric: She has a normal mood and affect. Her behavior is normal.    ED Course  Procedures (including critical care time)  DIAGNOSTIC STUDIES: Oxygen Saturation is 99% on room air, normal by my interpretation.    COORDINATION OF CARE: 6:09PM-Discussed x-ray of right shoulder and pain medications through IV and pt agreed to plan. 9:08PM-Discussed radiology report with pt and pt acknowledged  report. Discussed need to transfer pt to [facility] for observation and pt agreed to transfer.   Labs Reviewed - No data to display  Dg Ribs Unilateral W/chest Right  07/01/2011  *RADIOLOGY REPORT*  Clinical Data: Fall from horse  RIGHT RIBS AND CHEST - 3+ VIEW  Comparison: 05/20/2005  Findings: There are multiple right-sided posterior and anterolateral rib fractures involving at least the right second, third, fourth, fifth, sixth, seventh ribs posteriorly, and at least ribs two through six anterolaterally.  Right lateral pleural effusion/thickening noted.  There is a midshaft right clavicular fracture.  Diffuse right hemithoracic volume loss is present.  No pneumothorax is identified, although given the extent of osseous injury, this could be obscured given high clinical suspicion for this finding.  Right breast biopsy clip incidentally noted.  Right lower lobe granuloma again noted.  Left lung is clear.  Heart size normal.  IMPRESSION: Multiple right-sided rib fractures involving at least the second through seventh ribs posteriorly and anterolaterally, compatible with flail chest type injury.  Mid shaft right clavicular fracture.  Original Report Authenticated By: Harrel Lemon, M.D.   Dg Clavicle Right  07/01/2011  *RADIOLOGY REPORT*  Clinical Data: Right sided chest pain after being thrown from horse  RIGHT CLAVICLE - 2+ VIEWS  Comparison: Chest radiograph same date  Findings: Mid shaft right clavicular fracture with butterfly fracture fragment is not as well seen as on the prior dissimilar exam.  Right-sided rib fractures are partly visualized.  On these projections, a small right apical pneumothorax is better visualized due to differences in technique.  IMPRESSION: Right mid shaft clavicular fracture again noted.  Small right apical pneumothorax is now identified, likely better visualized given technique of the current exam.  Multiple right-sided rib fractures partly visualized, previously  reported.  Original Report Authenticated By: Harrel Lemon, M.D.   9:26 PM Pt has multiple right rib fractures, fracture right clavicle, small apical pneumothorax.  Call to Lolita Patella, M.D., on call to [facility] at [facility], who accepts her in transfer to 3300.    1. Multiple fractures of ribs of right side   2. Fracture of right clavicle   3. Pneumothorax, right    11:50 PM Crew from [facility] insisted on cervical spine films despite my advising them that pt had no symptome related to her neck and no physical finding suggesting neck injury.  C-spine x-ray was negative.   I personally performed the services described in this documentation, which was scribed in my presence. The recorded information has been reviewed and considered.  Osvaldo Human, M.D.   Angel Cooper III, MD 07/01/11 2129  Angel Cooper III, MD 07/01/11 [phone]

## 2011-07-01 NOTE — ED Notes (Signed)
Fall off horse and hurt right side of ribs and clavicle

## 2011-07-02 ENCOUNTER — Encounter (HOSPITAL_COMMUNITY): Payer: Self-pay | Admitting: *Deleted

## 2011-07-02 ENCOUNTER — Inpatient Hospital Stay (HOSPITAL_COMMUNITY): Payer: BC Managed Care – PPO

## 2011-07-02 DIAGNOSIS — S42001A Fracture of unspecified part of right clavicle, initial encounter for closed fracture: Secondary | ICD-10-CM | POA: Diagnosis present

## 2011-07-02 DIAGNOSIS — S270XXA Traumatic pneumothorax, initial encounter: Secondary | ICD-10-CM | POA: Diagnosis present

## 2011-07-02 DIAGNOSIS — S2241XA Multiple fractures of ribs, right side, initial encounter for closed fracture: Secondary | ICD-10-CM | POA: Diagnosis present

## 2011-07-02 DIAGNOSIS — I319 Disease of pericardium, unspecified: Secondary | ICD-10-CM

## 2011-07-02 DIAGNOSIS — S42009A Fracture of unspecified part of unspecified clavicle, initial encounter for closed fracture: Secondary | ICD-10-CM

## 2011-07-02 LAB — BASIC METABOLIC PANEL
Calcium: 8.6 mg/dL (ref 8.4–10.5)
GFR calc non Af Amer: >90 mL/min (ref >90)
Glucose, Bld: 105 mg/dL — ABNORMAL HIGH (ref 70–99)
Sodium: 140 mEq/L (ref 135–145)

## 2011-07-02 LAB — CBC
MCH: 30.9 pg (ref 26.0–34.0)
MCHC: 33.5 g/dL (ref 30.0–36.0)
Platelets: 207 10*3/uL (ref 150–400)

## 2011-07-02 LAB — URINALYSIS, ROUTINE W REFLEX MICROSCOPIC
Bilirubin Urine: NEGATIVE
Glucose, UA: NEGATIVE mg/dL
Hgb urine dipstick: NEGATIVE
Ketones, ur: NEGATIVE mg/dL
Leukocytes, UA: NEGATIVE
pH: 6 (ref 5.0–8.0)

## 2011-07-02 MED ORDER — BISACODYL 10 MG RE SUPP: 10.0000 mg | Freq: Every day | RECTAL | Status: DC | PRN

## 2011-07-02 MED ORDER — DOCUSATE SODIUM 100 MG PO CAPS
100.0000 mg | ORAL_CAPSULE | Freq: Two times a day (BID) | ORAL | Status: DC
  Administered 2011-07-02 – 2011-07-04 (×6): 100 mg via ORAL
  Filled 2011-07-02 (×6): qty 1

## 2011-07-02 MED ORDER — HYDROMORPHONE HCL PF 1 MG/ML IJ SOLN
0.5000 mg | INTRAMUSCULAR | Status: DC | PRN
  Administered 2011-07-02: 0.5 mg via INTRAVENOUS
  Filled 2011-07-02: qty 1

## 2011-07-02 MED ORDER — ENOXAPARIN SODIUM 40 MG/0.4ML ~~LOC~~ SOLN
40.0000 mg | SUBCUTANEOUS | Status: DC
  Administered 2011-07-02 – 2011-07-04 (×3): 40 mg via SUBCUTANEOUS
  Filled 2011-07-02 (×3): qty 0.4

## 2011-07-02 MED ORDER — OXYCODONE HCL 5 MG PO TABS: 10.0000 mg | ORAL_TABLET | ORAL | Status: DC | PRN

## 2011-07-02 MED ORDER — HYDROMORPHONE HCL PF 1 MG/ML IJ SOLN
2.0000 mg | INTRAMUSCULAR | Status: DC | PRN
  Administered 2011-07-02: 2 mg via INTRAVENOUS
  Filled 2011-07-02: qty 1
  Filled 2011-07-02: qty 2

## 2011-07-02 MED ORDER — PANTOPRAZOLE SODIUM 40 MG IV SOLR
40.0000 mg | Freq: Every day | INTRAVENOUS | Status: DC
  Administered 2011-07-02: 40 mg via INTRAVENOUS
  Filled 2011-07-02 (×2): qty 40

## 2011-07-02 MED ORDER — OXYCODONE HCL 5 MG PO TABS: 2.5000 mg | ORAL_TABLET | ORAL | Status: DC | PRN

## 2011-07-02 MED ORDER — HYDROMORPHONE HCL PF 1 MG/ML IJ SOLN
INTRAMUSCULAR | Status: AC
  Filled 2011-07-02: qty 1

## 2011-07-02 MED ORDER — ONDANSETRON HCL 4 MG/2ML IJ SOLN: 4.0000 mg | Freq: Four times a day (QID) | INTRAMUSCULAR | Status: DC | PRN

## 2011-07-02 MED ORDER — OXYCODONE HCL 5 MG PO TABS
5.0000 mg | ORAL_TABLET | ORAL | Status: DC | PRN
  Administered 2011-07-02: 5 mg via ORAL
  Filled 2011-07-02: qty 1

## 2011-07-02 MED ORDER — HYDROMORPHONE HCL PF 1 MG/ML IJ SOLN
1.0000 mg | Freq: Once | INTRAMUSCULAR | Status: AC
Start: 2011-07-02 — End: 2011-07-02
  Administered 2011-07-02: 1 mg via INTRAVENOUS

## 2011-07-02 MED ORDER — ONDANSETRON HCL 4 MG PO TABS: 4.0000 mg | ORAL_TABLET | Freq: Four times a day (QID) | ORAL | Status: DC | PRN

## 2011-07-02 MED ORDER — KCL IN DEXTROSE-NACL 20-5-0.45 MEQ/L-%-% IV SOLN
INTRAVENOUS | Status: DC
  Administered 2011-07-02 (×2): via INTRAVENOUS
  Filled 2011-07-02 (×4): qty 1000

## 2011-07-02 MED ORDER — PANTOPRAZOLE SODIUM 40 MG PO TBEC: 40.0000 mg | DELAYED_RELEASE_TABLET | Freq: Every day | ORAL | Status: DC

## 2011-07-02 MED ORDER — METHOCARBAMOL 500 MG PO TABS
500.0000 mg | ORAL_TABLET | Freq: Three times a day (TID) | ORAL | Status: DC
  Administered 2011-07-02 – 2011-07-04 (×6): 500 mg via ORAL
  Filled 2011-07-02 (×11): qty 1

## 2011-07-02 MED ORDER — HYDROMORPHONE HCL PF 1 MG/ML IJ SOLN
1.0000 mg | INTRAMUSCULAR | Status: DC | PRN
  Administered 2011-07-02 – 2011-07-03 (×7): 1 mg via INTRAVENOUS
  Filled 2011-07-02 (×7): qty 1

## 2011-07-02 NOTE — Progress Notes (Signed)
Clinical Social Work Department BRIEF PSYCHOSOCIAL ASSESSMENT 07/02/2011  Patient:  [name], [name]     Account Number:  [id number]     Admit date:  07/01/2011  Clinical Social Worker:  Pearson Forster  Date/Time:  07/02/2011 03:15 AM  Referred by:  CSW  Date Referred:  07/02/2011 Referred for  Psychosocial assessment   Other Referral:   Interview type:  Patient Other interview type:   Patient sister in room    PSYCHOSOCIAL DATA Living Status:  HUSBAND Admitted from facility:   Level of care:   Primary support name:  Sheilyn Boehlke [phone] Primary support relationship to patient:  SPOUSE Degree of support available:   strong    CURRENT CONCERNS Current Concerns  Other - See comment   Other Concerns:   Emotional Support/SBIRT Completion    SOCIAL WORK ASSESSMENT / PLAN Clinical Social Worker met with patient and patient at the bedside to offer support and discuss plans.  Patient lives at home with her husband. Patient suffered an accident while riding a horse which resulted in injury.  Patient plans to return home upon discharge with husband who is able to provide 24hr assistance if needed.  CSW will continue to follow and assist with patient discharge needs as necessary.   Assessment/plan status:  Psychosocial Support/Ongoing Assessment of Needs Other assessment/ plan:   Information/referral to community resources:   None at this time    PATIENT'S/FAMILY'S RESPONSE TO PLAN OF CARE: Patient was alert and oriented x3.  Patient was experiencing a lot of pain and was not very verbal. Patient was able to answer questions and patient's sister helped patient to communicate when necessary.   Arnette Norris, MSW Intern  [place], [place] [phone]

## 2011-07-02 NOTE — H&P (Signed)
[name] is an 61 y.o. female.   Chief Complaint: fall from horse, rib fractures HPI:  Pt was riding horse earlier this evening at a full gallop and took a turn.  The horse "went the right direction, but (she) didn't."  She recalls the fall and struck her right chest and shoulder.  She had no loss of consciousness, and she did not strike her head.  She denies nausea, vomiting, abdominal pain.  Her right chest "only hurts when I breathe."  She has not had a bad fall like this in a while.  She denies shortness of breath other than with deep breath.  She was not wearing a helmet.    Past Medical History  Diagnosis Date  . Perimenopausal   . Myocarditis, unspecified   . Unspecified disease of pericardium   . Pericardial effusion   . Pericarditis   . Headache   . Plantar fasciitis of right foot   . Allergic sinusitis     Past Surgical History  Procedure Date  . Pericardial window on november 11,2006, by dr bartle   . Other surgical history     post hysterectomy 1991  . Abdominal hysterectomy     Family History  Problem Relation Age of Onset  . Coronary artery disease Sister     stented at age 65   Social History:  reports that she has never smoked. She does not have any smokeless tobacco history on file. She reports that she drinks alcohol. She reports that she does not use illicit drugs.  Allergies:  Allergies  Allergen Reactions  . Penicillins Itching and Rash    Medications Prior to Admission  Medication Sig Dispense Refill  . Calcium Carb-Cholecalciferol (CALCIUM 500 +D PO) Take 1 tablet by mouth daily. 1 tab daily      . estrogens, conjugated, (PREMARIN) 0.625 MG tablet Take 0.625 mg by mouth daily.        . hydrochlorothiazide (HYDRODIURIL) 25 MG tablet Take 25 mg by mouth daily.        . Omega-3 Fatty Acids (FISH OIL BURP-LESS PO) Take 1 tablet by mouth daily.          Results for orders placed during the hospital encounter of 07/01/11 (from the past 48 hour(s))    CBC     Status: Abnormal   Collection Time   07/01/11  9:23 PM      Component Value Range Comment   WBC 11.3 (*) 4.0 - 10.5 (K/uL)    RBC 3.84 (*) 3.87 - 5.11 (MIL/uL)    Hemoglobin 11.9 (*) 12.0 - 15.0 (g/dL)    HCT 19.1 (*) 47.8 - 46.0 (%)    MCV 92.4  78.0 - 100.0 (fL)    MCH 31.0  26.0 - 34.0 (pg)    MCHC 33.5  30.0 - 36.0 (g/dL)    RDW 29.5  62.1 - 30.8 (%)    Platelets 213  150 - 400 (K/uL)   DIFFERENTIAL     Status: Abnormal   Collection Time   07/01/11  9:23 PM      Component Value Range Comment   Neutrophils Relative 90 (*) 43 - 77 (%)    Neutro Abs 10.2 (*) 1.7 - 7.7 (K/uL)    Lymphocytes Relative 5 (*) 12 - 46 (%)    Lymphs Abs 0.6 (*) 0.7 - 4.0 (K/uL)    Monocytes Relative 5  3 - 12 (%)    Monocytes Absolute 0.6  0.1 - 1.0 (  K/uL)    Eosinophils Relative 0  0 - 5 (%)    Eosinophils Absolute 0.0  0.0 - 0.7 (K/uL)    Basophils Relative 0  0 - 1 (%)    Basophils Absolute 0.0  0.0 - 0.1 (K/uL)   BASIC METABOLIC PANEL     Status: Abnormal   Collection Time   07/01/11  9:23 PM      Component Value Range Comment   Sodium 137  135 - 145 (mEq/L)    Potassium 3.7  3.5 - 5.1 (mEq/L)    Chloride 102  96 - 112 (mEq/L)    CO2 27  19 - 32 (mEq/L)    Glucose, Bld 104 (*) 70 - 99 (mg/dL)    BUN 16  6 - 23 (mg/dL)    Creatinine, Ser 9.60  0.50 - 1.10 (mg/dL)    Calcium 9.1  8.4 - 10.5 (mg/dL)    GFR calc non Af Amer 90 (*) >90 (mL/min)    GFR calc Af Amer >90  >90 (mL/min)   MRSA PCR SCREENING     Status: Normal   Collection Time   07/02/11 12:43 AM      Component Value Range Comment   MRSA by PCR NEGATIVE  NEGATIVE    URINALYSIS, ROUTINE W REFLEX MICROSCOPIC     Status: Normal   Collection Time   07/02/11 12:44 AM      Component Value Range Comment   Color, Urine YELLOW  YELLOW     APPearance CLEAR  CLEAR     Specific Gravity, Urine 1.022  1.005 - 1.030     pH 6.0  5.0 - 8.0     Glucose, UA NEGATIVE  NEGATIVE (mg/dL)    Hgb urine dipstick NEGATIVE  NEGATIVE      Bilirubin Urine NEGATIVE  NEGATIVE     Ketones, ur NEGATIVE  NEGATIVE (mg/dL)    Protein, ur NEGATIVE  NEGATIVE (mg/dL)    Urobilinogen, UA 0.2  0.0 - 1.0 (mg/dL)    Nitrite NEGATIVE  NEGATIVE     Leukocytes, UA NEGATIVE  NEGATIVE  MICROSCOPIC NOT DONE ON URINES WITH NEGATIVE PROTEIN, BLOOD, LEUKOCYTES, NITRITE, OR GLUCOSE <1000 mg/dL.   Dg Ribs Unilateral W/chest Right  07/01/2011  *RADIOLOGY REPORT*  Clinical Data: Fall from horse  RIGHT RIBS AND CHEST - 3+ VIEW  Comparison: 05/20/2005  Findings: There are multiple right-sided posterior and anterolateral rib fractures involving at least the right second, third, fourth, fifth, sixth, seventh ribs posteriorly, and at least ribs two through six anterolaterally.  Right lateral pleural effusion/thickening noted.  There is a midshaft right clavicular fracture.  Diffuse right hemithoracic volume loss is present.  No pneumothorax is identified, although given the extent of osseous injury, this could be obscured given high clinical suspicion for this finding.  Right breast biopsy clip incidentally noted.  Right lower lobe granuloma again noted.  Left lung is clear.  Heart size normal.  IMPRESSION: Multiple right-sided rib fractures involving at least the second through seventh ribs posteriorly and anterolaterally, compatible with flail chest type injury.  Mid shaft right clavicular fracture.  Original Report Authenticated By: Harrel Lemon, M.D.   Dg Cervical Spine Complete  07/01/2011  *RADIOLOGY REPORT*  Clinical Data: Thrown from horse.  Clavicle injury.  Rib fractures.  CERVICAL SPINE - COMPLETE 4+ VIEW  Comparison: Chest radiograph 07/01/2011 and shoulder radiograph 07/01/2011  Findings: The cervical spine is normally aligned from the skull base through the cervicothoracic junction.  C1  and C2 vertebral bodies are aligned and the dens appears intact.  No acute cervical spine fracture is identified.  Multilevel facet joint degenerative changes are  present.  Bony neural foraminal narrowing is suspected at multiple levels bilaterally.  The prevertebral soft tissue contour is normal.  Right midshaft clavicle fracture is priors partially imaged. Several upper right-sided rib fractures are noted.  Small right apical pneumothorax is again seen.  IMPRESSION:  1.  No evidence of acute bony injury to the cervical spine. 2.  Small apical pneumothorax, as recently described on shoulder radiographs. 3.  Right clavicle and several upper right rib fractures visualized, as previously described on chest and shoulder radiographs.  Original Report Authenticated By: Britta Mccreedy, M.D.   Dg Clavicle Right  07/01/2011  *RADIOLOGY REPORT*  Clinical Data: Right sided chest pain after being thrown from horse  RIGHT CLAVICLE - 2+ VIEWS  Comparison: Chest radiograph same date  Findings: Mid shaft right clavicular fracture with butterfly fracture fragment is not as well seen as on the prior dissimilar exam.  Right-sided rib fractures are partly visualized.  On these projections, a small right apical pneumothorax is better visualized due to differences in technique.  IMPRESSION: Right mid shaft clavicular fracture again noted.  Small right apical pneumothorax is now identified, likely better visualized given technique of the current exam.  Multiple right-sided rib fractures partly visualized, previously reported.  Original Report Authenticated By: Harrel Lemon, M.D.    Review of Systems  Constitutional: Negative.   HENT: Negative.   Eyes: Negative.   Respiratory: Positive for shortness of breath (when tries to take a deep breath).   Cardiovascular: Positive for chest pain.  Gastrointestinal: Negative.   Genitourinary: Negative.   Musculoskeletal: Positive for joint pain (collarbone pain).  Skin: Negative.   Neurological: Negative.   Endo/Heme/Allergies: Negative.   Psychiatric/Behavioral: Negative.     Blood pressure 128/76, pulse 79, temperature 98 F (36.7  C), temperature source Oral, resp. rate 19, height 5\' 3"  (1.6 m), weight 127 lb 13.9 oz (58 kg), SpO2 94.00%.   Physical Exam  Constitutional: She is oriented to person, place, and time. She appears well-developed and well-nourished. Distressed: looks mildly uncomfortable.  HENT:  Head: Normocephalic and atraumatic.  Right Ear: External ear normal.  Left Ear: External ear normal.  Mouth/Throat: No oropharyngeal exudate.  Eyes: Conjunctivae are normal. Pupils are equal, round, and reactive to light. No scleral icterus.  Neck: Normal range of motion. Neck supple. No tracheal deviation present. No thyromegaly present.  Cardiovascular: Normal rate, regular rhythm, normal heart sounds and intact distal pulses.  Exam reveals no gallop and no friction rub.   No murmur heard. Respiratory: Effort normal and breath sounds normal. No respiratory distress. She has no wheezes. She has no rales. She exhibits tenderness.  GI: Soft. Bowel sounds are normal. She exhibits no distension and no mass. There is no tenderness. There is no rebound and no guarding.  Musculoskeletal: Normal range of motion. She exhibits tenderness.       Very tender over right clavicle  Lymphadenopathy:    She has no cervical adenopathy.  Neurological: She is alert and oriented to person, place, and time. Coordination normal.  Skin: Skin is warm and dry. No rash noted. She is not diaphoretic. No erythema. No pallor.  Psychiatric: She has a normal mood and affect. Her behavior is normal. Judgment and thought content normal.     Assessment/Plan Multiple right sided rib fractures Pain control, pulmonary toilet.  R  pneumothorax Oxygen treatment Cardiac monitoring Repeat CXR  R clavicle fracture Sling Orthopaedics consult  Pericarditis Continue prednisone.  HTN Continue HCTZ  Enoxaparin for VTE prophylaxis.  Akil Hoos 07/02/2011, 3:04 AM

## 2011-07-02 NOTE — Progress Notes (Signed)
UR completed 

## 2011-07-02 NOTE — Progress Notes (Signed)
Subjective: C/o rib pain. No n/v. Hurts to take deep breath  Objective: Vital signs in last 24 hours: Temp:  [98 F (36.7 C)-98.1 F (36.7 C)] 98 F (36.7 C) (04/17 0447) Pulse Rate:  [57-80] 72  (04/17 0447) Resp:  [13-22] 13  (04/17 0447) BP: (114-131)/(58-76) 114/65 mmHg (04/17 0447) SpO2:  [94 %-99 %] 97 % (04/17 0447) Weight:  [127 lb 13.9 oz (58 kg)-128 lb (58.06 kg)] 127 lb 13.9 oz (58 kg) (04/17 0040) Last BM Date: 07/01/11  Intake/Output from previous day: 04/16 0701 - 04/17 0700 In: -  Out: 250 [Urine:250] Intake/Output this shift:    Alert, nad Sling RUE, good radial, good mvmt CTA but decreased bs at bases abd soft, nt +SCD  Lab Results:   [facility] 07/02/11 0430 07/01/11 2123  WBC 8.0 11.3*  HGB 12.4 11.9*  HCT 37.0 35.5*  PLT 207 213   BMET  Basename 07/02/11 0430 07/01/11 2123  NA 140 137  K 3.7 3.7  CL 105 102  CO2 28 27  GLUCOSE 105* 104*  BUN 14 16  CREATININE 0.68 0.78  CALCIUM 8.6 9.1   PT/INR No results found for this basename: LABPROT:2,INR:2 in the last 72 hours ABG No results found for this basename: PHART:2,PCO2:2,PO2:2,HCO3:2 in the last 72 hours  Studies/Results: Dg Ribs Unilateral W/chest Right  07/01/2011  *RADIOLOGY REPORT*  Clinical Data: Fall from horse  RIGHT RIBS AND CHEST - 3+ VIEW  Comparison: 05/20/2005  Findings: There are multiple right-sided posterior and anterolateral rib fractures involving at least the right second, third, fourth, fifth, sixth, seventh ribs posteriorly, and at least ribs two through six anterolaterally.  Right lateral pleural effusion/thickening noted.  There is a midshaft right clavicular fracture.  Diffuse right hemithoracic volume loss is present.  No pneumothorax is identified, although given the extent of osseous injury, this could be obscured given high clinical suspicion for this finding.  Right breast biopsy clip incidentally noted.  Right lower lobe granuloma again noted.  Left lung is  clear.  Heart size normal.  IMPRESSION: Multiple right-sided rib fractures involving at least the second through seventh ribs posteriorly and anterolaterally, compatible with flail chest type injury.  Mid shaft right clavicular fracture.  Original Report Authenticated By: Harrel Lemon, M.D.   Dg Cervical Spine Complete  07/01/2011  *RADIOLOGY REPORT*  Clinical Data: Thrown from horse.  Clavicle injury.  Rib fractures.  CERVICAL SPINE - COMPLETE 4+ VIEW  Comparison: Chest radiograph 07/01/2011 and shoulder radiograph 07/01/2011  Findings: The cervical spine is normally aligned from the skull base through the cervicothoracic junction.  C1 and C2 vertebral bodies are aligned and the dens appears intact.  No acute cervical spine fracture is identified.  Multilevel facet joint degenerative changes are present.  Bony neural foraminal narrowing is suspected at multiple levels bilaterally.  The prevertebral soft tissue contour is normal.  Right midshaft clavicle fracture is priors partially imaged. Several upper right-sided rib fractures are noted.  Small right apical pneumothorax is again seen.  IMPRESSION:  1.  No evidence of acute bony injury to the cervical spine. 2.  Small apical pneumothorax, as recently described on shoulder radiographs. 3.  Right clavicle and several upper right rib fractures visualized, as previously described on chest and shoulder radiographs.  Original Report Authenticated By: Britta Mccreedy, M.D.   Dg Clavicle Right  07/01/2011  *RADIOLOGY REPORT*  Clinical Data: Right sided chest pain after being thrown from horse  RIGHT CLAVICLE - 2+ VIEWS  Comparison:  Chest radiograph same date  Findings: Mid shaft right clavicular fracture with butterfly fracture fragment is not as well seen as on the prior dissimilar exam.  Right-sided rib fractures are partly visualized.  On these projections, a small right apical pneumothorax is better visualized due to differences in technique.  IMPRESSION:  Right mid shaft clavicular fracture again noted.  Small right apical pneumothorax is now identified, likely better visualized given technique of the current exam.  Multiple right-sided rib fractures partly visualized, previously reported.  Original Report Authenticated By: Harrel Lemon, M.D.   Dg Chest Port 1 View  07/02/2011  *RADIOLOGY REPORT*  Clinical Data: Chest pain.  Rib fracture and pneumothorax.  PORTABLE CHEST - 1 VIEW  Comparison: 07/01/2011.  Findings: Numerous displaced right-sided rib fractures are again noted, some appearing to be segmental.  Adjacent loculated pleural fluid or extrapleural hematoma appears stable.  There is no pneumothorax.  There are lower lung volumes with increased bibasilar atelectasis.  Heart size and mediastinal contours are stable.  IMPRESSION: Worsened bibasilar aeration.  No pneumothorax identified.  Original Report Authenticated By: Gerrianne Scale, M.D.    Anti-infectives: Anti-infectives    None      Assessment/Plan: S/p fall Multiple rib fxs Rt clavicle fx - sling  Pain control, pulm toilet Ambulate, OOB  [name]. [name], MD, FACS General, Bariatric, & Minimally Invasive Surgery [facility], [place]   LOS: 1 day    [name] 07/02/2011

## 2011-07-02 NOTE — Consult Note (Signed)
Reason for Consult: Evaluate right clavicle fracture Referring Physician: Dr. Redmond [name] is an 61 y.o. female.  HPI: The patient is a 60 year old otherwise healthy female who was thrown from a horse at full gallop yesterday. She landed on her right side mostly on the shoulder. She was evaluated by the trauma team and was diagnosed with multiple right-sided rib fractures and right clavicle fracture. There is initially some concern about tenting of the skin at the clavicle fracture. She has moderate to severe pain which is worse with movement better with rest. No associated numbness or tingling. The left upper extremity or bilateral lower extremity injuries.  Past Medical History  Diagnosis Date  . Perimenopausal   . Myocarditis, unspecified   . Unspecified disease of pericardium   . Pericardial effusion   . Pericarditis   . Headache   . Plantar fasciitis of right foot   . Allergic sinusitis     Past Surgical History  Procedure Date  . Pericardial window on november 11,2006, by dr bartle   . Other surgical history     post hysterectomy 1991  . Abdominal hysterectomy     Family History  Problem Relation Age of Onset  . Coronary artery disease Sister     stented at age 79    Social History:  reports that she has never smoked. She does not have any smokeless tobacco history on file. She reports that she drinks alcohol. She reports that she does not use illicit drugs.  Allergies:  Allergies  Allergen Reactions  . Penicillins Itching and Rash    Medications:  Scheduled:   . docusate sodium  100 mg Oral BID  . enoxaparin  40 mg Subcutaneous Q24H  . HYDROmorphone      . HYDROmorphone      .  HYDROmorphone (DILAUDID) injection  1 mg Intravenous Once  .  HYDROmorphone (DILAUDID) injection  1 mg Intravenous Once  .  HYDROmorphone (DILAUDID) injection  1 mg Intravenous Once  .  HYDROmorphone (DILAUDID) injection  1 mg Intravenous Once  .  HYDROmorphone (DILAUDID)  injection  1 mg Intravenous Once  . ondansetron  4 mg Intravenous Once  . ondansetron  4 mg Intravenous Once  . pantoprazole  40 mg Oral Q1200   Or  . pantoprazole (PROTONIX) IV  40 mg Intravenous Q1200    Results for orders placed during the hospital encounter of 07/01/11 (from the past 48 hour(s))  CBC     Status: Abnormal   Collection Time   07/01/11  9:23 PM      Component Value Range Comment   WBC 11.3 (*) 4.0 - 10.5 (K/uL)    RBC 3.84 (*) 3.87 - 5.11 (MIL/uL)    Hemoglobin 11.9 (*) 12.0 - 15.0 (g/dL)    HCT 16.1 (*) 09.6 - 46.0 (%)    MCV 92.4  78.0 - 100.0 (fL)    MCH 31.0  26.0 - 34.0 (pg)    MCHC 33.5  30.0 - 36.0 (g/dL)    RDW 04.5  40.9 - 81.1 (%)    Platelets 213  150 - 400 (K/uL)   DIFFERENTIAL     Status: Abnormal   Collection Time   07/01/11  9:23 PM      Component Value Range Comment   Neutrophils Relative 90 (*) 43 - 77 (%)    Neutro Abs 10.2 (*) 1.7 - 7.7 (K/uL)    Lymphocytes Relative 5 (*) 12 - 46 (%)  Lymphs Abs 0.6 (*) 0.7 - 4.0 (K/uL)    Monocytes Relative 5  3 - 12 (%)    Monocytes Absolute 0.6  0.1 - 1.0 (K/uL)    Eosinophils Relative 0  0 - 5 (%)    Eosinophils Absolute 0.0  0.0 - 0.7 (K/uL)    Basophils Relative 0  0 - 1 (%)    Basophils Absolute 0.0  0.0 - 0.1 (K/uL)   BASIC METABOLIC PANEL     Status: Abnormal   Collection Time   07/01/11  9:23 PM      Component Value Range Comment   Sodium 137  135 - 145 (mEq/L)    Potassium 3.7  3.5 - 5.1 (mEq/L)    Chloride 102  96 - 112 (mEq/L)    CO2 27  19 - 32 (mEq/L)    Glucose, Bld 104 (*) 70 - 99 (mg/dL)    BUN 16  6 - 23 (mg/dL)    Creatinine, Ser 6.07  0.50 - 1.10 (mg/dL)    Calcium 9.1  8.4 - 10.5 (mg/dL)    GFR calc non Af Amer 90 (*) >90 (mL/min)    GFR calc Af Amer >90  >90 (mL/min)   MRSA PCR SCREENING     Status: Normal   Collection Time   07/02/11 12:43 AM      Component Value Range Comment   MRSA by PCR NEGATIVE  NEGATIVE    URINALYSIS, ROUTINE W REFLEX MICROSCOPIC     Status:  Normal   Collection Time   07/02/11 12:44 AM      Component Value Range Comment   Color, Urine YELLOW  YELLOW     APPearance CLEAR  CLEAR     Specific Gravity, Urine 1.022  1.005 - 1.030     pH 6.0  5.0 - 8.0     Glucose, UA NEGATIVE  NEGATIVE (mg/dL)    Hgb urine dipstick NEGATIVE  NEGATIVE     Bilirubin Urine NEGATIVE  NEGATIVE     Ketones, ur NEGATIVE  NEGATIVE (mg/dL)    Protein, ur NEGATIVE  NEGATIVE (mg/dL)    Urobilinogen, UA 0.2  0.0 - 1.0 (mg/dL)    Nitrite NEGATIVE  NEGATIVE     Leukocytes, UA NEGATIVE  NEGATIVE  MICROSCOPIC NOT DONE ON URINES WITH NEGATIVE PROTEIN, BLOOD, LEUKOCYTES, NITRITE, OR GLUCOSE <1000 mg/dL.  CBC     Status: Normal   Collection Time   07/02/11  4:30 AM      Component Value Range Comment   WBC 8.0  4.0 - 10.5 (K/uL)    RBC 4.01  3.87 - 5.11 (MIL/uL)    Hemoglobin 12.4  12.0 - 15.0 (g/dL)    HCT 37.1  06.2 - 69.4 (%)    MCV 92.3  78.0 - 100.0 (fL)    MCH 30.9  26.0 - 34.0 (pg)    MCHC 33.5  30.0 - 36.0 (g/dL)    RDW 85.4  62.7 - 03.5 (%)    Platelets 207  150 - 400 (K/uL)   BASIC METABOLIC PANEL     Status: Abnormal   Collection Time   07/02/11  4:30 AM      Component Value Range Comment   Sodium 140  135 - 145 (mEq/L)    Potassium 3.7  3.5 - 5.1 (mEq/L)    Chloride 105  96 - 112 (mEq/L)    CO2 28  19 - 32 (mEq/L)    Glucose, Bld 105 (*) 70 - 99 (  mg/dL)    BUN 14  6 - 23 (mg/dL)    Creatinine, Ser 5.62  0.50 - 1.10 (mg/dL)    Calcium 8.6  8.4 - 10.5 (mg/dL)    GFR calc non Af Amer >90  >90 (mL/min)    GFR calc Af Amer >90  >90 (mL/min)     Dg Ribs Unilateral W/chest Right  07/01/2011  *RADIOLOGY REPORT*  Clinical Data: Fall from horse  RIGHT RIBS AND CHEST - 3+ VIEW  Comparison: 05/20/2005  Findings: There are multiple right-sided posterior and anterolateral rib fractures involving at least the right second, third, fourth, fifth, sixth, seventh ribs posteriorly, and at least ribs two through six anterolaterally.  Right lateral pleural  effusion/thickening noted.  There is a midshaft right clavicular fracture.  Diffuse right hemithoracic volume loss is present.  No pneumothorax is identified, although given the extent of osseous injury, this could be obscured given high clinical suspicion for this finding.  Right breast biopsy clip incidentally noted.  Right lower lobe granuloma again noted.  Left lung is clear.  Heart size normal.  IMPRESSION: Multiple right-sided rib fractures involving at least the second through seventh ribs posteriorly and anterolaterally, compatible with flail chest type injury.  Mid shaft right clavicular fracture.  Original Report Authenticated By: Harrel Lemon, M.D.   Dg Cervical Spine Complete  07/01/2011  *RADIOLOGY REPORT*  Clinical Data: Thrown from horse.  Clavicle injury.  Rib fractures.  CERVICAL SPINE - COMPLETE 4+ VIEW  Comparison: Chest radiograph 07/01/2011 and shoulder radiograph 07/01/2011  Findings: The cervical spine is normally aligned from the skull base through the cervicothoracic junction.  C1 and C2 vertebral bodies are aligned and the dens appears intact.  No acute cervical spine fracture is identified.  Multilevel facet joint degenerative changes are present.  Bony neural foraminal narrowing is suspected at multiple levels bilaterally.  The prevertebral soft tissue contour is normal.  Right midshaft clavicle fracture is priors partially imaged. Several upper right-sided rib fractures are noted.  Small right apical pneumothorax is again seen.  IMPRESSION:  1.  No evidence of acute bony injury to the cervical spine. 2.  Small apical pneumothorax, as recently described on shoulder radiographs. 3.  Right clavicle and several upper right rib fractures visualized, as previously described on chest and shoulder radiographs.  Original Report Authenticated By: Britta Mccreedy, M.D.   Dg Clavicle Right  07/01/2011  *RADIOLOGY REPORT*  Clinical Data: Right sided chest pain after being thrown from horse   RIGHT CLAVICLE - 2+ VIEWS  Comparison: Chest radiograph same date  Findings: Mid shaft right clavicular fracture with butterfly fracture fragment is not as well seen as on the prior dissimilar exam.  Right-sided rib fractures are partly visualized.  On these projections, a small right apical pneumothorax is better visualized due to differences in technique.  IMPRESSION: Right mid shaft clavicular fracture again noted.  Small right apical pneumothorax is now identified, likely better visualized given technique of the current exam.  Multiple right-sided rib fractures partly visualized, previously reported.  Original Report Authenticated By: Harrel Lemon, M.D.    Review of Systems  All other systems reviewed and are negative.   Blood pressure 114/65, pulse 72, temperature 98 F (36.7 C), temperature source Oral, resp. rate 13, height 5\' 3"  (1.6 m), weight 58 kg (127 lb 13.9 oz), SpO2 97.00%. Physical Exam  Constitutional: She is oriented to person, place, and time. She appears well-developed and well-nourished.  HENT:  Head: Atraumatic.  Eyes:  EOM are normal.  Cardiovascular: Intact distal pulses.   Respiratory: Effort normal.  GI: Soft.  Musculoskeletal:       The right shoulder and clavicle show mild swelling in the middle third of the clavicle. Can palpate the fracture. The skin is intact and not at all attempted at this time. There is no impending skin compromise. She has tenderness in the middle third clavicle. No tenderness over the proximal humerus. No elbow or wrist tenderness. Bilateral upper extremities are neurovascularly intact. Bilateral lower extremities are atraumatic.  Neurological: She is alert and oriented to person, place, and time.  Skin: Skin is warm and dry.  Psychiatric: She has a normal mood and affect.    Assessment/Plan: Right middle third comminuted clavicle fracture. At this time there is no potential for skin compromise. Given the x-ray findings at this time  she has not had significant shortening. I recommend that we observe this for now and treat her with a sling for comfort. I think she still has a good chance of doing well with conservative management. Given the high-energy nature of the injury and the associated rib fractures we will have to follow this closely and if things become more displaced, more shortened, or she has more skin issues when she is up and about, then we will need to consider fixing this. I will follow along closely while she is in the hospital and after discharge she can follow up with me in about one week. She can be in her sling for comfort.  Mable Paris 07/02/2011, 6:58 AM

## 2011-07-03 ENCOUNTER — Inpatient Hospital Stay (HOSPITAL_COMMUNITY): Payer: BC Managed Care – PPO

## 2011-07-03 MED ORDER — TRAMADOL HCL 50 MG PO TABS
100.0000 mg | ORAL_TABLET | Freq: Four times a day (QID) | ORAL | Status: DC
  Administered 2011-07-03 – 2011-07-04 (×4): 100 mg via ORAL
  Filled 2011-07-03 (×4): qty 2

## 2011-07-03 MED ORDER — SUMATRIPTAN SUCCINATE 25 MG PO TABS
25.0000 mg | ORAL_TABLET | ORAL | Status: DC | PRN
  Administered 2011-07-03 – 2011-07-04 (×3): 25 mg via ORAL
  Filled 2011-07-03 (×3): qty 1

## 2011-07-03 MED ORDER — OXYCODONE HCL 5 MG PO TABS
5.0000 mg | ORAL_TABLET | ORAL | Status: DC | PRN
  Administered 2011-07-03 – 2011-07-04 (×3): 5 mg via ORAL
  Filled 2011-07-03 (×3): qty 1

## 2011-07-03 MED ORDER — NAPROXEN 500 MG PO TABS: 500.0000 mg | ORAL_TABLET | Freq: Two times a day (BID) | ORAL | Status: DC

## 2011-07-03 MED ORDER — HYDROMORPHONE HCL PF 1 MG/ML IJ SOLN: 0.5000 mg | INTRAMUSCULAR | Status: DC | PRN

## 2011-07-03 NOTE — Progress Notes (Signed)
Patient ID: [name], female   DOB: 10-Dec-1950, 61 y.o.   MRN: [id number]   LOS: 2 days   Subjective: Pain controlled as long as she doesn't move though she did get up with RN and walk.  Objective: Vital signs in last 24 hours: Temp:  [98.1 F (36.7 C)-98.6 F (37 C)] 98.2 F (36.8 C) (04/18 0759) Pulse Rate:  [73-80] 77  (04/18 0410) Resp:  [13-18] 18  (04/18 0410) BP: (114-144)/(60-74) 144/61 mmHg (04/18 0410) SpO2:  [93 %-99 %] 93 % (04/18 0410) Last BM Date: 07/01/11   IS:   General appearance: alert and no distress Resp: clear to auscultation bilaterally Cardio: regular rate and rhythm GI: normal findings: bowel sounds normal and soft, non-tender  Assessment/Plan: Fall from horse Multiple right rib fxs w/PTX -- Check CXR today Right clavicle fx -- Sling, f/u as OP with Dr. Ave Filter Pericarditis FEN -- Change to orals for pain, add tramadol VTE -- Lovenox Dispo -- Transfer to floor. Will have PT eval since she'll be home alone at times.  Freeman Caldron, PA-C Pager: [phone] General Trauma PA Pager: [phone]   07/03/2011

## 2011-07-03 NOTE — Progress Notes (Signed)
This patient has been seen and I agree with the findings and treatment plan.  Eldred Sooy O. Gohan Collister, III, MD, FACS [phone] (pager) [phone] (direct pager) Trauma Surgeon  

## 2011-07-03 NOTE — Progress Notes (Signed)
PATIENT ID: [name]  MRN: [id number]  DOB/AGE:  59/16/1952 / 61 y.o.       Subjective: Pain is improving.  No skin issues.  OOB yesterday.   Objective: Vital signs in last 24 hours: Temp:  [98.1 F (36.7 C)-98.6 F (37 C)] 98.1 F (36.7 C) (04/18 0410) Pulse Rate:  [73-80] 77  (04/18 0410) Resp:  [13-18] 18  (04/18 0410) BP: (114-144)/(60-74) 144/61 mmHg (04/18 0410) SpO2:  [93 %-99 %] 93 % (04/18 0410)  Intake/Output from previous day: 04/17 0701 - 04/18 0700 In: 600 [I.V.:600] Out: 1800 [Urine:1800] Intake/Output this shift:     Basename 07/02/11 0430 07/01/11 2123  HGB 12.4 11.9*    Basename 07/02/11 0430 07/01/11 2123  WBC 8.0 11.3*  RBC 4.01 3.84*  HCT 37.0 35.5*  PLT 207 213    Basename 07/02/11 0430 07/01/11 2123  NA 140 137  K 3.7 3.7  CL 105 102  CO2 28 27  BUN 14 16  CREATININE 0.68 0.78  GLUCOSE 105* 104*  CALCIUM 8.6 9.1   No results found for this basename: LABPT:2,INR:2 in the last 72 hours  Physical Exam: Skin over clavicle intact, no change. NVID  Assessment/Plan:      Skin looks ok, continue treatment in sling for now.   Follow up one week after d/c for re XR.   Mable Paris 07/03/2011, 7:03 AM

## 2011-07-03 NOTE — Progress Notes (Signed)
Pt TX to 5040, VSS, amb in room, had PCXR

## 2011-07-04 ENCOUNTER — Inpatient Hospital Stay (HOSPITAL_COMMUNITY): Payer: BC Managed Care – PPO

## 2011-07-04 MED ORDER — METHOCARBAMOL 500 MG PO TABS: 500.0000 mg | ORAL_TABLET | Freq: Three times a day (TID) | ORAL | Status: AC

## 2011-07-04 MED ORDER — OXYCODONE HCL 5 MG PO TABS: 5.0000 mg | ORAL_TABLET | ORAL | Status: AC | PRN

## 2011-07-04 MED ORDER — TRAMADOL HCL 50 MG PO TABS: 100.0000 mg | ORAL_TABLET | Freq: Four times a day (QID) | ORAL | Status: AC

## 2011-07-04 MED ORDER — DSS 100 MG PO CAPS: 100.0000 mg | ORAL_CAPSULE | Freq: Two times a day (BID) | ORAL | Status: AC

## 2011-07-04 NOTE — Discharge Instructions (Signed)
Clavicle Fracture A clavicle fracture is a break in the collarbone. This is a common injury, especially in children. Collarbones do not harden until around the age of 53. Most collarbone fractures are treated with a simple arm sling. In some cases a figure-of-eight splint is used to help hold the broken bones in position. Although not often needed, surgery may be required if the bone fragments are not in the correct position (displaced).  HOME CARE INSTRUCTIONS   Apply ice to the injury for 15 to 20 minutes each hour while awake for 2 days. Put the ice in a plastic bag and place a towel between the bag of ice and your skin.   Wear the sling or splint constantly for as long as directed by your caregiver. You may remove the sling or splint for bathing or showering. Be sure to keep your shoulder in the same place as when the sling or splint is on. Do not lift your arm.   If a figure-of-eight splint is applied, it must be tightened by another person every day. Tighten it enough to keep the shoulders held back. Allow enough room to place the index finger between the body and strap. Loosen the splint immediately if you feel numbness or tingling in your hands.   Only take over-the-counter or prescription medicines for pain, discomfort, or fever as directed by your caregiver.   Avoid activities that irritate or increase the pain for 4 to 6 weeks after surgery.   Follow all instructions for follow-up with your caregiver. This includes any referrals, physical therapy, and rehabilitation. Any delay in obtaining necessary care could result in a delay or failure of the injury to heal properly.  SEEK MEDICAL CARE IF:  You have pain and swelling that are not relieved with medications. SEEK IMMEDIATE MEDICAL CARE IF:  Your arm is numb, cold, or pale, even when the splint is loose. MAKE SURE YOU:   Understand these instructions.   Will watch your condition.   Will get help right away if you are not doing  well or get worse.  Document Released: 12/11/2004 Document Revised: 02/20/2011 Document Reviewed: 10/07/2007 [facility] Patient Information 2012 [place], [place].Clavicle Fracture A clavicle fracture is a break in the collarbone. This is a common injury, especially in children. Collarbones do not harden until around the age of 44. Most collarbone fractures are treated with a simple arm sling. In some cases a figure-of-eight splint is used to help hold the broken bones in position. Although not often needed, surgery may be required if the bone fragments are not in the correct position (displaced).  HOME CARE INSTRUCTIONS   Apply ice to the injury for 15 to 20 minutes each hour while awake for 2 days. Put the ice in a plastic bag and place a towel between the bag of ice and your skin.   Wear the sling or splint constantly for as long as directed by your caregiver. You may remove the sling or splint for bathing or showering. Be sure to keep your shoulder in the same place as when the sling or splint is on. Do not lift your arm.   If a figure-of-eight splint is applied, it must be tightened by another person every day. Tighten it enough to keep the shoulders held back. Allow enough room to place the index finger between the body and strap. Loosen the splint immediately if you feel numbness or tingling in your hands.   Only take over-the-counter or prescription medicines for pain,  discomfort, or fever as directed by your caregiver.   Avoid activities that irritate or increase the pain for 4 to 6 weeks after surgery.   Follow all instructions for follow-up with your caregiver. This includes any referrals, physical therapy, and rehabilitation. Any delay in obtaining necessary care could result in a delay or failure of the injury to heal properly.  SEEK MEDICAL CARE IF:  You have pain and swelling that are not relieved with medications. SEEK IMMEDIATE MEDICAL CARE IF:  Your arm is numb, cold, or pale, even  when the splint is loose. MAKE SURE YOU:   Understand these instructions.   Will watch your condition.   Will get help right away if you are not doing well or get worse.  Document Released: 12/11/2004 Document Revised: 02/20/2011 Document Reviewed: 10/07/2007 [facility] Patient Information 2012 [place], [place].

## 2011-07-04 NOTE — Discharge Summary (Signed)
Physician Discharge Summary  Patient ID: [name] MRN: [id number] DOB/AGE: Aug 04, 1950 61 y.o.  Admit date: 07/01/2011 Discharge date: 07/04/2011  Admission Diagnoses: Fall from horse with multiple right rib fractures and clavicle fracture  Discharge Diagnoses:  Active Problems:  Fall from horse  Multiple fractures of ribs of right side  Right clavicle fracture  Pneumothorax, traumatic   Discharged Condition: good  Hospital Course:Pt was riding horse earlier this evening at a full gallop and took a turn.  The horse "went the right direction, but (she) didn't."  She recalls the fall and struck her right chest and shoulder.  She had no loss of consciousness, and she did not strike her head.  She denies nausea, vomiting, abdominal pain.  Her right chest "only hurts when I breathe."  She has not had a bad fall like this in a while.  She denies shortness of breath other than with deep breath.  She was not wearing a helmet.      She was seen in the ED and work up revealed multiple right rib fx with a small ptx and right clavicle fx. She was admitted for pain control and mobilization and serial CXR to follow her pneumothorax.  Follow up CXR have shown improvement in the ptx, but she was developing a small right pleural effusion. Clinically she was asymptomatic and doing well with mobilizing and pain control and it was felt she was medically stable for discharge home . She will follow up in Trauma Clinic next week.   She was seen by Dr. Ave Filter for right clavicle fracture and conservative treatment with a sling and follow up in the office next week was recommended.  Per Dr. Ave Filter patient WBAT R LE, no ROM to R shoulder, okay for ROM at elbow, wrist, and hand. patient functioning well but requires assist for bed mobility due to decreased ability to use R UE. Patient reports husband to assist her prior to him leaving for work. Patient safe to return home this date.  Pt is discharged in stable  and improved condition.   Consults: orthopedic surgery- Dr. Ave Filter  Significant Diagnostic Studies: radiology: X-Ray: PORTABLE CHEST - 1 VIEW  Comparison: 07/03/2011 and 07/01/2011  Findings: Again noted is a comminuted right clavicle fracture.  There are multiple right rib fractures. There are increased  densities at the right lung base concerning for pleural fluid or  hemothorax. Again noted are left basilar densities suggesting  pleural fluid and atelectasis. Heart and mediastinum are stable.  IMPRESSION:  Increased right basilar densities suggesting increased right  pleural fluid or hemothorax.  Left basilar atelectasis and left pleural fluid.  Right rib fractures and right clavicle fracture.   Treatments: analgesia: Ultram, Robaxin and oxyIR  Discharge Exam: Blood pressure 120/65, pulse 80, temperature 98.4 F (36.9 C), temperature source Oral, resp. rate 20, height 5\' 3"  (1.6 m), weight 58 kg (127 lb 13.9 oz), SpO2 95.00%.  General appearance: alert and no distress Resp: clear to auscultation bilaterally Cardio: regular rate and rhythm GI: normal findings: bowel sounds normal and soft, non-tender Right shoulder ecchymotic and edematous  Disposition: Discharged home    Medication List  As of 07/04/2011 10:32 AM   TAKE these medications         CALCIUM 500 +D PO   Take 1 tablet by mouth daily. 1 tab daily      DSS 100 MG Caps   Take 100 mg by mouth 2 (two) times daily.  estrogens (conjugated) 0.625 MG tablet   Commonly known as: PREMARIN   Take 0.625 mg by mouth daily.      FISH OIL BURP-LESS PO   Take 1 tablet by mouth daily.      hydrochlorothiazide 25 MG tablet   Commonly known as: HYDRODIURIL   Take 25 mg by mouth daily.      methocarbamol 500 MG tablet   Commonly known as: ROBAXIN   Take 1 tablet (500 mg total) by mouth 3 (three) times daily.      oxyCODONE 5 MG immediate release tablet   Commonly known as: Oxy IR/ROXICODONE   Take 1 tablet (5  mg total) by mouth every 4 (four) hours as needed (5mg  for mild pain, 10mg  for moderate pain, 15mg  for severe pain).      SUMAtriptan 25 MG tablet   Commonly known as: IMITREX   Take 25 mg by mouth every 2 (two) hours as needed. FOR MIGRAINES      traMADol 50 MG tablet   Commonly known as: ULTRAM   Take 2 tablets (100 mg total) by mouth every 6 (six) hours.           Follow-up Information    Follow up with Mable Paris, MD. Schedule an appointment as soon as possible for a visit in 1 week.   Contact information:   [facility] [address] [place] [address] [phone]       Follow up with [facility] [place] on 07/10/2011. (@ 2:15pm)    Contact information:   [phone]         Signed: Franki Monte Pager [phone] General Trauma Pager [phone]

## 2011-07-04 NOTE — Discharge Summary (Signed)
Okay to go home.  This patient has been seen and I agree with the findings and treatment plan.  Jayson Waterhouse O. Deslyn Cavenaugh, III, MD, FACS [phone] (pager) [phone] (direct pager) Trauma Surgeon 

## 2011-07-04 NOTE — Evaluation (Signed)
Occupational Therapy Evaluation Patient Details Name: [name] MRN: [id number] DOB: [birth year] Today's Date: 07/04/2011  OT Assessment / Plan / Recommendation Clinical Impression  Pt presents to OT s/p fall from horse. Pt presents with clavicle fracture (WBAT, NO ROM to R shoulder, ROM as tolerated to R elbow, wrist, hand.). Education complete. Pt will have help at home as needed intially.          Equipment Recommendations  None recommended by OT        Precautions / Restrictions Restrictions Weight Bearing Restrictions: Yes RUE Weight Bearing: Weight bearing as tolerated       ADL  Eating/Feeding: Performed;Supervision/safety Where Assessed - Eating/Feeding: Edge of bed Grooming: Performed;Minimal assistance Where Assessed - Grooming: Standing at sink Upper Body Bathing: Performed;Minimal assistance Where Assessed - Upper Body Bathing: Standing at sink Lower Body Bathing: Simulated;Minimal assistance Where Assessed - Lower Body Bathing: Sit to stand from chair Upper Body Dressing: Performed;Minimal assistance Where Assessed - Upper Body Dressing: Sitting, bed;Unsupported Lower Body Dressing: Performed;Minimal assistance Where Assessed - Lower Body Dressing: Sit to stand from bed Toilet Transfer: Performed;Supervision/safety Toilet Transfer Method: Ambulating Toileting - Clothing Manipulation: Performed;Supervision/safety Where Assessed - Toileting Clothing Manipulation: Standing Toileting - Hygiene: Supervision/safety;Simulated Where Assessed - Toileting Hygiene: Standing Tub/Shower Transfer: Print production planner: Walk in shower ADL Comments: Husband will assist as needed. Pt instructed in peforming ADL activity without moving R shoulder. Instructed in correct positioning of sling for support, as well as positioning while in sitting or in bed (pillow gently supporting arm)                 Prior Functioning  Home  Living Lives With: Spouse Available Help at Discharge:  (spouse works during the day 6am-3pm, dtr home on [employer]) Type of Home: House Home Access: Stairs to enter Secretary/administrator of Steps: 2 Entrance Stairs-Rails: Can reach both Home Layout: One level Bathroom Shower/Tub: Health visitor: Handicapped height Bathroom Accessibility: Yes How Accessible: Accessible via walker Prior Function Level of Independence: Independent Able to Take Stairs?: Yes Driving: Yes Vocation: Retired Musician: No difficulties Dominant Hand: Right    Cognition  Overall Cognitive Status: Appears within functional limits for tasks assessed/performed Arousal/Alertness: Awake/alert Orientation Level: Appears intact for tasks assessed Behavior During Session: WFL for tasks performed Cognition - Other Comments: w    Extremity/Trunk Assessment Right Upper Extremity Assessment RUE ROM/Strength/Tone: Due to pain;Due to precautions ( ) RUE Sensation: WFL - Light Touch Left Upper Extremity Assessment LUE ROM/Strength/Tone: WFL for tasks assessed LUE Sensation: WFL - Light Touch Right Lower Extremity Assessment RLE ROM/Strength/Tone: Within functional levels RLE Sensation: WFL - Light Touch Left Lower Extremity Assessment LLE ROM/Strength/Tone: WFL for tasks assessed LLE Sensation: WFL - Light Touch Trunk Assessment Trunk Assessment: Normal   Mobility Bed Mobility Bed Mobility: Rolling Left;Left Sidelying to Sit Rolling Left: 4: Min assist Left Sidelying to Sit: 4: Min guard Details for Bed Mobility Assistance: Pt will need a bed rail at home.  Husband will obtain Transfers Transfers: Sit to Stand Sit to Stand: 5: Supervision Details for Transfer Assistance: increased time      Balance Balance Balance Assessed: No  End of Session OT - End of Session Activity Tolerance: Patient tolerated treatment well Patient left: in bed;with call bell/phone within  reach;with family/visitor present   Dylan Monforte, [place] Kung 07/04/2011, 11:32 AM

## 2011-07-04 NOTE — Progress Notes (Signed)
Physical Therapy Evaluation Note   07/04/11 0802  PT Visit Information  Last PT Received On 07/04/11  PT Time Calculation  PT Start Time 0802  PT Stop Time 0837  PT Time Calculation (min) 35 min  Subjective Data  Subjective Pt received supine in bed with c/o migraine at 6/10.  Restrictions  Weight Bearing Restrictions Yes  RUE Weight Bearing WBAT, NO ROM to R shoulder, ROM as tolerated to R elbow, wrist, hand.  Home Living  Lives With Spouse  Available Help at Discharge (spouse works during the day 6am-3pm, dtr home on [employer])  Type of Home House  Home Access Stairs to enter  Entrance Stairs-Number of Steps 2  Entrance Stairs-Rails Can reach both  Home Layout One level  Catering manager Handicapped height  Bathroom Accessibility Yes  How Accessible Accessible via walker  Prior Function  Level of Independence Independent  Able to Take Stairs? Yes  Driving Yes  Vocation Retired  Geneticist, molecular No difficulties  Cognition  Overall Cognitive Status Appears within functional limits for tasks assessed/performed  Arousal/Alertness Awake/alert  Orientation Level Appears intact for tasks assessed  Behavior During Session [facility] for tasks performed  Cognition - Other Comments w  Sensation  Light Touch Appears Intact  Right Upper Extremity Assessment  RUE ROM/Strength/Tone Due to pain;Due to precautions ( )  RUE Sensation WFL - Light Touch  Left Upper Extremity Assessment  LUE ROM/Strength/Tone WFL for tasks assessed  LUE Sensation WFL - Light Touch  Right Lower Extremity Assessment  RLE ROM/Strength/Tone WFL  RLE Sensation WFL - Light Touch  Left Lower Extremity Assessment  LLE ROM/Strength/Tone WFL for tasks assessed  LLE Sensation WFL - Light Touch  Trunk Assessment  Trunk Assessment Normal  Bed Mobility  Bed Mobility Rolling Left;Left Sidelying to Sit  Rolling Left 4: Min assist  Left Sidelying to Sit 4: Min assist    Details for Bed Mobility Assistance pt required assist at trunk due to pt inability to use R UE functionally  Transfers  Transfers Sit to Stand  Sit to Stand 4: Min guard  Details for Transfer Assistance increased time  Ambulation/Gait  Ambulation/Gait Assistance 4: Min guard  Ambulation Distance (Feet) 200 Feet  Assistive device None  Ambulation/Gait Assistance Details pt with guarded gait however WFL for injury, no LOB noted  Gait Pattern WFL  Gait velocity slow  Stairs Yes  Stairs Assistance 4: Min guard  Stair Management Technique One rail Left  Number of Stairs 2   Wheelchair Mobility  Wheelchair Mobility No  Balance  Balance Assessed No  Exercises  Exercises (completed AA R elbow flex/ext, active wrist/hand)  PT - End of Session  Equipment Utilized During Treatment Gait belt (R UE sling)  Activity Tolerance Patient tolerated treatment well  Patient left in chair;with call bell/phone within reach  Nurse Communication Mobility status  PT Assessment  Clinical Impression Statement Pt s/p fall off horse presenting with R clavicle and rib fx presenting with sling for comfort. Per Dr. Ave Filter patient WBAT R LE, no ROM to R shoulder, okay for ROM at elbow, wrist, and hand. patient functioning well but requires assist for bed mobility due to decreased ability to use R UE. Patient reports husband to assist her prior to him leaving for work. Patient safe to return home this date.  PT Recommendation/Assessment Patent does not need any further PT services  No Skilled PT All education completed;Patient is supervision for all activity/mobility  PT  Recommendation  Follow Up Recommendations No PT follow up (until cleared by Dr. Ave Filter)  Equipment Recommended None recommended by PT  Acute Rehab PT Goals  PT Goal Formulation NO goals set due to patient no longer requires skilled PT at this time.  Written Expression  Dominant Hand Right     Pain: 6/10 migraine. Patient with increased  pain in R shoulder with elbow flex however reports R shoulder pain to be controlled overall.  Lewis Shock, PT, DPT Pager #: [phone] Office #: [phone]

## 2011-07-04 NOTE — Progress Notes (Signed)
Clinical Social Worker met with patient for SBIRT completion.  Patient has no ETOH concerns at this time.  Patient plan is to return home upon discharge where husband is available to provide 24 hour support to patient.  Clinical Social Worker will sign off for now as social work intervention is no longer needed. Please consult [place] again if new need arises.  Arnette Norris, MSW Intern  [place], [place] [phone]

## 2011-07-04 NOTE — Progress Notes (Signed)
Patient ID: JEANICE DEMPSEY, female   DOB: 1951/02/27, 61 y.o.   MRN: [id number]   LOS: 3 days   Subjective: Doing much better with pain control .   Objective: Vital signs in last 24 hours: Temp:  [98.1 F (36.7 C)-98.4 F (36.9 C)] 98.4 F (36.9 C) (04/19 0621) Pulse Rate:  [75-80] 80  (04/19 0621) Resp:  [18-20] 20  (04/19 0621) BP: (119-124)/(60-65) 120/65 mmHg (04/19 0621) SpO2:  [93 %-95 %] 95 % (04/19 0621) Last BM Date: 07/01/11   IS:   General appearance: alert and no distress Resp: clear to auscultation bilaterally Cardio: regular rate and rhythm GI: normal findings: bowel sounds normal and soft, non-tender Right shoulder ecchymotic and edematous Assessment/Plan: Fall from horse Multiple right rib fxs w/PTX -- CXR without ptx, but some pleural fluid. Pt doing well clinically.  Right clavicle fx -- Sling, f/u as OP with Dr. Ave Filter Pericarditis FEN -- no issues VTE -- Lovenox Dispo -- Dc to home today with follow up in Clinic next week  RAYBURN,SHAWN,PA-C Pager [phone] General Trauma Pager [phone]   07/04/2011

## 2011-07-04 NOTE — Progress Notes (Signed)
This patient has been seen and I agree with the findings and treatment plan.  Jaquay Morneault O. Zayvon Alicea, III, MD, FACS [phone] (pager) [phone] (direct pager) Trauma Surgeon  

## 2011-07-10 ENCOUNTER — Encounter (HOSPITAL_BASED_OUTPATIENT_CLINIC_OR_DEPARTMENT_OTHER): Payer: Self-pay | Admitting: *Deleted

## 2011-07-10 ENCOUNTER — Encounter (INDEPENDENT_AMBULATORY_CARE_PROVIDER_SITE_OTHER): Payer: BC Managed Care – PPO

## 2011-07-10 NOTE — Progress Notes (Signed)
No labs needed-had them in er Dr Ave Filter told her she would stay rcc-to bring meds and overnight bag

## 2011-07-15 ENCOUNTER — Encounter (HOSPITAL_BASED_OUTPATIENT_CLINIC_OR_DEPARTMENT_OTHER): Payer: Self-pay | Admitting: Anesthesiology

## 2011-07-15 ENCOUNTER — Ambulatory Visit (HOSPITAL_COMMUNITY): Payer: BC Managed Care – PPO

## 2011-07-15 ENCOUNTER — Encounter (HOSPITAL_BASED_OUTPATIENT_CLINIC_OR_DEPARTMENT_OTHER): Payer: Self-pay

## 2011-07-15 ENCOUNTER — Encounter (HOSPITAL_BASED_OUTPATIENT_CLINIC_OR_DEPARTMENT_OTHER)
Admission: RE | Disposition: A | Discharge status: Home / Self Care | Payer: Self-pay | Source: Ambulatory Visit | Attending: Orthopedic Surgery

## 2011-07-15 ENCOUNTER — Ambulatory Visit (HOSPITAL_BASED_OUTPATIENT_CLINIC_OR_DEPARTMENT_OTHER): Payer: BC Managed Care – PPO | Admitting: Anesthesiology

## 2011-07-15 ENCOUNTER — Ambulatory Visit (HOSPITAL_BASED_OUTPATIENT_CLINIC_OR_DEPARTMENT_OTHER)
Admission: RE | Admit: 2011-07-15 | Discharge: 2011-07-16 | Disposition: A | Discharge status: Home / Self Care | Payer: BC Managed Care – PPO | Source: Ambulatory Visit | Attending: Orthopedic Surgery | Admitting: Orthopedic Surgery

## 2011-07-15 DIAGNOSIS — S42001A Fracture of unspecified part of right clavicle, initial encounter for closed fracture: Secondary | ICD-10-CM

## 2011-07-15 DIAGNOSIS — S42023A Displaced fracture of shaft of unspecified clavicle, initial encounter for closed fracture: Secondary | ICD-10-CM | POA: Insufficient documentation

## 2011-07-15 HISTORY — PX: ORIF CLAVICULAR FRACTURE: SHX5055

## 2011-07-15 SURGERY — OPEN REDUCTION INTERNAL FIXATION (ORIF) CLAVICULAR FRACTURE
Anesthesia: Choice | Site: Shoulder | Laterality: Right | Wound class: Clean

## 2011-07-15 MED ORDER — ZOLPIDEM TARTRATE 5 MG PO TABS: 5.0000 mg | ORAL_TABLET | Freq: Every evening | ORAL | Status: DC | PRN

## 2011-07-15 MED ORDER — SUCCINYLCHOLINE CHLORIDE 20 MG/ML IJ SOLN
INTRAMUSCULAR | Status: DC | PRN
  Administered 2011-07-15: 100 mg via INTRAVENOUS

## 2011-07-15 MED ORDER — OXYCODONE HCL 5 MG PO TABS
5.0000 mg | ORAL_TABLET | ORAL | Status: DC | PRN
  Administered 2011-07-15 – 2011-07-16 (×3): 10 mg via ORAL

## 2011-07-15 MED ORDER — MORPHINE SULFATE 10 MG/ML IJ SOLN
INTRAMUSCULAR | Status: DC | PRN
  Administered 2011-07-15: 3 mg via INTRAVENOUS
  Administered 2011-07-15: 2 mg via INTRAVENOUS
  Administered 2011-07-15: 3 mg via INTRAVENOUS
  Administered 2011-07-15: 2 mg via INTRAVENOUS

## 2011-07-15 MED ORDER — HYDROCHLOROTHIAZIDE 25 MG PO TABS: 25.0000 mg | ORAL_TABLET | Freq: Every day | ORAL | Status: DC

## 2011-07-15 MED ORDER — FENTANYL CITRATE 0.05 MG/ML IJ SOLN
INTRAMUSCULAR | Status: DC | PRN
  Administered 2011-07-15: 100 ug via INTRAVENOUS

## 2011-07-15 MED ORDER — LACTATED RINGERS IV SOLN
INTRAVENOUS | Status: DC
  Administered 2011-07-15: 12:00:00 via INTRAVENOUS

## 2011-07-15 MED ORDER — HYDROMORPHONE HCL PF 1 MG/ML IJ SOLN
0.2500 mg | INTRAMUSCULAR | Status: DC | PRN
  Administered 2011-07-15 (×3): 0.5 mg via INTRAVENOUS

## 2011-07-15 MED ORDER — MIDAZOLAM HCL 5 MG/5ML IJ SOLN
INTRAMUSCULAR | Status: DC | PRN
  Administered 2011-07-15: 2 mg via INTRAVENOUS

## 2011-07-15 MED ORDER — CEFAZOLIN SODIUM 1-5 GM-% IV SOLN
1.0000 g | Freq: Four times a day (QID) | INTRAVENOUS | Status: AC
  Administered 2011-07-15 – 2011-07-16 (×3): 1 g via INTRAVENOUS

## 2011-07-15 MED ORDER — LIDOCAINE HCL (CARDIAC) 20 MG/ML IV SOLN
INTRAVENOUS | Status: DC | PRN
  Administered 2011-07-15: 50 mg via INTRAVENOUS

## 2011-07-15 MED ORDER — KCL IN DEXTROSE-NACL 20-5-0.45 MEQ/L-%-% IV SOLN
INTRAVENOUS | Status: DC
  Administered 2011-07-15: 17:00:00 via INTRAVENOUS

## 2011-07-15 MED ORDER — TRAMADOL HCL 50 MG PO TABS
100.0000 mg | ORAL_TABLET | Freq: Four times a day (QID) | ORAL | Status: DC
  Administered 2011-07-15 – 2011-07-16 (×2): 100 mg via ORAL

## 2011-07-15 MED ORDER — DEXAMETHASONE SODIUM PHOSPHATE 4 MG/ML IJ SOLN
INTRAMUSCULAR | Status: DC | PRN
  Administered 2011-07-15: 10 mg via INTRAVENOUS

## 2011-07-15 MED ORDER — BUPIVACAINE HCL (PF) 0.25 % IJ SOLN
INTRAMUSCULAR | Status: DC | PRN
  Administered 2011-07-15: 18 mL

## 2011-07-15 MED ORDER — METOCLOPRAMIDE HCL 5 MG PO TABS: 5.0000 mg | ORAL_TABLET | Freq: Three times a day (TID) | ORAL | Status: DC | PRN

## 2011-07-15 MED ORDER — ESTROGENS CONJUGATED 0.625 MG PO TABS: 0.6250 mg | ORAL_TABLET | Freq: Every day | ORAL | Status: DC

## 2011-07-15 MED ORDER — ONDANSETRON HCL 4 MG/2ML IJ SOLN: 4.0000 mg | Freq: Four times a day (QID) | INTRAMUSCULAR | Status: DC | PRN

## 2011-07-15 MED ORDER — ONDANSETRON HCL 4 MG/2ML IJ SOLN
INTRAMUSCULAR | Status: DC | PRN
  Administered 2011-07-15: 4 mg via INTRAVENOUS

## 2011-07-15 MED ORDER — PROPOFOL 10 MG/ML IV EMUL
INTRAVENOUS | Status: DC | PRN
  Administered 2011-07-15: 200 mg via INTRAVENOUS

## 2011-07-15 MED ORDER — DOCUSATE SODIUM 100 MG PO CAPS: 100.0000 mg | ORAL_CAPSULE | Freq: Two times a day (BID) | ORAL | Status: DC

## 2011-07-15 MED ORDER — SUMATRIPTAN SUCCINATE 25 MG PO TABS: 25.0000 mg | ORAL_TABLET | ORAL | Status: DC | PRN

## 2011-07-15 MED ORDER — ONDANSETRON HCL 4 MG/2ML IJ SOLN: 4.0000 mg | Freq: Once | INTRAMUSCULAR | Status: DC | PRN

## 2011-07-15 MED ORDER — MORPHINE SULFATE 2 MG/ML IJ SOLN
2.0000 mg | INTRAMUSCULAR | Status: DC | PRN
  Administered 2011-07-15 – 2011-07-16 (×3): 2 mg via INTRAVENOUS

## 2011-07-15 MED ORDER — CEFAZOLIN SODIUM 1-5 GM-% IV SOLN
1.0000 g | Freq: Once | INTRAVENOUS | Status: AC
  Administered 2011-07-15: 1 g via INTRAVENOUS

## 2011-07-15 MED ORDER — METHOCARBAMOL 500 MG PO TABS
500.0000 mg | ORAL_TABLET | Freq: Three times a day (TID) | ORAL | Status: DC
  Administered 2011-07-15 – 2011-07-16 (×3): 500 mg via ORAL

## 2011-07-15 MED ORDER — ONDANSETRON HCL 4 MG PO TABS: 4.0000 mg | ORAL_TABLET | Freq: Four times a day (QID) | ORAL | Status: DC | PRN

## 2011-07-15 MED ORDER — METOCLOPRAMIDE HCL 5 MG/ML IJ SOLN: 5.0000 mg | Freq: Three times a day (TID) | INTRAMUSCULAR | Status: DC | PRN

## 2011-07-15 MED ORDER — DIPHENHYDRAMINE HCL 12.5 MG/5ML PO ELIX: 12.5000 mg | ORAL_SOLUTION | ORAL | Status: DC | PRN

## 2011-07-15 SURGICAL SUPPLY — 61 items
2.8MM DRILL BIT ×1 IMPLANT
APL SKNCLS STERI-STRIP NONHPOA (GAUZE/BANDAGES/DRESSINGS)
BENZOIN TINCTURE PRP APPL 2/3 (GAUZE/BANDAGES/DRESSINGS) IMPLANT
BLADE SURG 15 STRL LF DISP TIS (BLADE) ×1 IMPLANT
BLADE SURG 15 STRL SS (BLADE) ×2
BLADE SURG ROTATE 9660 (MISCELLANEOUS) IMPLANT
CANISTER SUCTION 2500CC (MISCELLANEOUS) IMPLANT
CHLORAPREP W/TINT 26ML (MISCELLANEOUS) ×2 IMPLANT
CLOTH BEACON ORANGE TIMEOUT ST (SAFETY) ×2 IMPLANT
DECANTER SPIKE VIAL GLASS SM (MISCELLANEOUS) IMPLANT
DRAPE C-ARM 42X72 X-RAY (DRAPES) ×2 IMPLANT
DRAPE INCISE IOBAN 66X45 STRL (DRAPES) ×2 IMPLANT
DRAPE U-SHAPE 47X51 STRL (DRAPES) ×4 IMPLANT
DRAPE U-SHAPE 76X120 STRL (DRAPES) ×4 IMPLANT
DRSG PAD ABDOMINAL 8X10 ST (GAUZE/BANDAGES/DRESSINGS) ×2 IMPLANT
ELECT REM PT RETURN 9FT ADLT (ELECTROSURGICAL) ×2
ELECTRODE REM PT RTRN 9FT ADLT (ELECTROSURGICAL) ×1 IMPLANT
GAUZE SPONGE 4X4 16PLY XRAY LF (GAUZE/BANDAGES/DRESSINGS) IMPLANT
GLOVE BIO SURGEON STRL SZ7 (GLOVE) ×1 IMPLANT
GLOVE BIO SURGEON STRL SZ7.5 (GLOVE) ×2 IMPLANT
GLOVE BIOGEL PI IND STRL 8 (GLOVE) ×1 IMPLANT
GLOVE BIOGEL PI INDICATOR 8 (GLOVE) ×1
GLOVE INDICATOR 7.0 STRL GRN (GLOVE) ×1 IMPLANT
GLOVE SKINSENSE NS SZ7.0 (GLOVE) ×1
GLOVE SKINSENSE STRL SZ7.0 (GLOVE) IMPLANT
GOWN PREVENTION PLUS XLARGE (GOWN DISPOSABLE) ×4 IMPLANT
NS IRRIG 1000ML POUR BTL (IV SOLUTION) IMPLANT
PACK ARTHROSCOPY DSU (CUSTOM PROCEDURE TRAY) ×2 IMPLANT
PACK BASIN DAY SURGERY FS (CUSTOM PROCEDURE TRAY) ×2 IMPLANT
PENCIL BUTTON HOLSTER BLD 10FT (ELECTRODE) ×2 IMPLANT
PLATE CLAVICAL 8H RT (Plate) ×1 IMPLANT
SCREW 2.7MMX14.0MM (Screw) ×1 IMPLANT
SCREW 3.5MMX10.0MM (Screw) ×2 IMPLANT
SCREW 3.5MMX12.0MM (Screw) ×3 IMPLANT
SCREW 3.5MMX14.0MM (Screw) ×1 IMPLANT
SHEET MEDIUM DRAPE 40X70 STRL (DRAPES) IMPLANT
SLING ARM FOAM STRAP LRG (SOFTGOODS) IMPLANT
SLING ARM FOAM STRAP MED (SOFTGOODS) ×1 IMPLANT
SLING ARM FOAM STRAP XLG (SOFTGOODS) IMPLANT
SLING ARM IMMOBILIZER MED (SOFTGOODS) IMPLANT
SPONGE GAUZE 4X4 12PLY (GAUZE/BANDAGES/DRESSINGS) ×2 IMPLANT
SPONGE LAP 4X18 X RAY DECT (DISPOSABLE) ×2 IMPLANT
STRIP CLOSURE SKIN 1/2X4 (GAUZE/BANDAGES/DRESSINGS) IMPLANT
SUCTION FRAZIER TIP 10 FR DISP (SUCTIONS) IMPLANT
SUPPORT WRAP ARM LG (MISCELLANEOUS) ×1 IMPLANT
SUT ETHILON 4 0 PS 2 18 (SUTURE) IMPLANT
SUT MNCRL AB 3-0 PS2 18 (SUTURE) IMPLANT
SUT MNCRL AB 4-0 PS2 18 (SUTURE) ×1 IMPLANT
SUT PDS AB 0 CT 36 (SUTURE) IMPLANT
SUT PROLENE 3 0 PS 2 (SUTURE) ×2 IMPLANT
SUT VIC AB 0 CT1 18XCR BRD 8 (SUTURE) IMPLANT
SUT VIC AB 0 CT1 27 (SUTURE) ×2
SUT VIC AB 0 CT1 27XBRD ANBCTR (SUTURE) IMPLANT
SUT VIC AB 0 CT1 8-18 (SUTURE)
SUT VIC AB 2-0 CT1 27 (SUTURE) ×2
SUT VIC AB 2-0 CT1 TAPERPNT 27 (SUTURE) IMPLANT
SUT VIC AB 2-0 SH 18 (SUTURE) IMPLANT
SYR BULB 3OZ (MISCELLANEOUS) ×2 IMPLANT
TOWEL OR 17X24 6PK STRL BLUE (TOWEL DISPOSABLE) ×2 IMPLANT
TOWEL OR NON WOVEN STRL DISP B (DISPOSABLE) ×2 IMPLANT
WATER STERILE IRR 1000ML POUR (IV SOLUTION) ×1 IMPLANT

## 2011-07-15 NOTE — H&P (Signed)
[name] is an 61 y.o. female.   Chief Complaint: R shoulder pain HPI: s/p fall off horse with comminuted dislplaced clavicle fracture  Past Medical History  Diagnosis Date  . Perimenopausal   . Myocarditis, unspecified   . Unspecified disease of pericardium   . Pericardial effusion   . Pericarditis   . Headache   . Plantar fasciitis of right foot   . Allergic sinusitis   . Pericarditis     history 2006 from virus    Past Surgical History  Procedure Date  . Pericardial window on november 11,2006, by dr bartle   . Other surgical history     post hysterectomy 1991  . Abdominal hysterectomy   . Colonoscopy     Family History  Problem Relation Age of Onset  . Coronary artery disease Sister     stented at age 75   Social History:  reports that she has never smoked. She does not have any smokeless tobacco history on file. She reports that she drinks alcohol. She reports that she does not use illicit drugs.  Allergies:  Allergies  Allergen Reactions  . Penicillins Itching and Rash    Medications Prior to Admission  Medication Sig Dispense Refill  . Calcium Carb-Cholecalciferol (CALCIUM 500 +D PO) Take 1 tablet by mouth daily. 1 tab daily      . docusate sodium 100 MG CAPS Take 100 mg by mouth 2 (two) times daily.  10 capsule    . estrogens, conjugated, (PREMARIN) 0.625 MG tablet Take 0.625 mg by mouth daily.        . hydrochlorothiazide (HYDRODIURIL) 25 MG tablet Take 25 mg by mouth daily.        . methocarbamol (ROBAXIN) 500 MG tablet Take 1 tablet (500 mg total) by mouth 3 (three) times daily.  60 tablet  1  . Omega-3 Fatty Acids (FISH OIL BURP-LESS PO) Take 1 tablet by mouth daily.        Marland Kitchen oxyCODONE (OXY IR/ROXICODONE) 5 MG immediate release tablet Take 1 tablet (5 mg total) by mouth every 4 (four) hours as needed (5mg  for mild pain, 10mg  for moderate pain, 15mg  for severe pain).  60 tablet  0  . SUMAtriptan (IMITREX) 25 MG tablet Take 25 mg by mouth every 2 (two)  hours as needed. FOR MIGRAINES      . traMADol (ULTRAM) 50 MG tablet Take 2 tablets (100 mg total) by mouth every 6 (six) hours.  90 tablet  1    No results found for this or any previous visit (from the past 48 hour(s)). No results found.  Review of Systems  All other systems reviewed and are negative.    Blood pressure 133/82, pulse 65, temperature 97.9 F (36.6 C), temperature source Oral, resp. rate 20, SpO2 100.00%. Physical Exam  Constitutional: She is oriented to person, place, and time. She appears well-developed and well-nourished.  HENT:  Head: Atraumatic.  Eyes: EOM are normal.  Cardiovascular: Intact distal pulses.   Respiratory: Effort normal.  Musculoskeletal:       Right shoulder: She exhibits decreased range of motion, tenderness and swelling.  Neurological: She is alert and oriented to person, place, and time.  Skin: Skin is warm and dry.     Assessment/Plan R comminuted displaced clavicle fracture Plan for ORIF Risks / benefits of surgery discussed Consent on chart  NPO for OR Preop antibiotics Will get preop chest XR to ensure small PTX completely resolved.  Mable Paris 07/15/2011, 12:02  PM

## 2011-07-15 NOTE — Anesthesia Preprocedure Evaluation (Signed)
Anesthesia Evaluation  Patient identified by MRN, date of birth, ID band Patient awake    Reviewed: Allergy & Precautions, H&P , NPO status , Patient's Chart, lab work & pertinent test results  Airway Mallampati: I TM Distance: >3 FB Neck ROM: Full    Dental  (+) Teeth Intact and Dental Advisory Given   Pulmonary  Small R PTX noted on R clavicle xray on 07/01/11.  CXR on 07/04/11 had no mention of resolution of same.  I will repeat CXR today. breath sounds clear to auscultation        Cardiovascular Exercise Tolerance: Good Rhythm:Regular Rate:Normal  Hx of pericarditis years ago.  No sequelae.   Neuro/Psych    GI/Hepatic   Endo/Other    Renal/GU      Musculoskeletal   Abdominal   Peds  Hematology   Anesthesia Other Findings   Reproductive/Obstetrics                           Anesthesia Physical Anesthesia Plan  ASA: II  Anesthesia Plan: General   Post-op Pain Management:    Induction: Intravenous  Airway Management Planned: Fiberoptic Intubation Planned  Additional Equipment:   Intra-op Plan:   Post-operative Plan: Extubation in OR  Informed Consent: I have reviewed the patients History and Physical, chart, labs and discussed the procedure including the risks, benefits and alternatives for the proposed anesthesia with the patient or authorized representative who has indicated his/her understanding and acceptance.   Dental advisory given  Plan Discussed with: CRNA, Anesthesiologist and Surgeon  Anesthesia Plan Comments:         Anesthesia Quick Evaluation

## 2011-07-15 NOTE — Discharge Instructions (Signed)
Discharge Instructions after Open Elbow Surgery   A sling may be provided for you. If so, remain in your sling at all times. This includes sleeping in the sling.  Use ice on the elbow intermittently over the first 48 hours after surgery. Pain medicine has been prescribed for you.  Use your medicine liberally over the first 48 hours, and then you can begin to taper your use. You may take Extra Strength Tylenol or Tylenol only in place of the pain pills.  Leave the dressing in place until your follow-up appointment  You may shower after surgery. The dressing CANNOT get wet during your shower. Wrap the dressing with a dry towel and place your arm in a large trash bag.  Take one aspirin a day for 2 weeks after surgery, unless you have an aspirin sensitivity/ allergy or asthma.  You may receive Indocin (Indomethacin) to take after surgery. If so, take as per the prescription. You MUST take this medication on a full stomach. If you begin to experience stomach pain, discontinue the Indocin. Otherwise, continue to take it regardless of how your elbow feels. While taking Indocin DO NOT take ANY nonsteroidal anti-inflammatory pain medications: Advil, Motrin, Ibuprofen, Aleve, Naproxen, or Narprosyn.   Please call [phone] during normal business hours or [phone] after hours for any problems. Including the following:  - excessive redness of the incisions - drainage for more than 4 days - fever of more than 101.5 F  *Please note that pain medications will not be refilled after hours or on weekends.      Post Anesthesia Home Care Instructions  Activity: Get plenty of rest for the remainder of the day. A responsible adult should stay with you for 24 hours following the procedure.  For the next 24 hours, DO NOT: -Drive a car -Advertising copywriter -Drink alcoholic beverages -Take any medication unless instructed by your physician -Make any legal decisions or sign important  papers.  Meals: Start with liquid foods such as gelatin or soup. Progress to regular foods as tolerated. Avoid greasy, spicy, heavy foods. If nausea and/or vomiting occur, drink only clear liquids until the nausea and/or vomiting subsides. Call your physician if vomiting continues.  Special Instructions/Symptoms: Your throat may feel dry or sore from the anesthesia or the breathing tube placed in your throat during surgery. If this causes discomfort, gargle with warm salt water. The discomfort should disappear within 24 hours.

## 2011-07-15 NOTE — Anesthesia Procedure Notes (Signed)
Procedure Name: Intubation Performed by: York Grice Pre-anesthesia Checklist: Patient identified, Timeout performed, Emergency Drugs available, Suction available and Patient being monitored Patient Re-evaluated:Patient Re-evaluated prior to inductionOxygen Delivery Method: Circle system utilized Preoxygenation: Pre-oxygenation with 100% oxygen Intubation Type: IV induction Ventilation: Mask ventilation without difficulty Laryngoscope Size: Miller and 2 Grade View: Grade II Tube type: Oral Tube size: 7.0 mm Number of attempts: 1 Placement Confirmation: ETT inserted through vocal cords under direct vision,  breath sounds checked- equal and bilateral and positive ETCO2 Secured at: 21 cm Tube secured with: Tape Dental Injury: Teeth and Oropharynx as per pre-operative assessment

## 2011-07-15 NOTE — Op Note (Signed)
Procedure(s): OPEN REDUCTION INTERNAL FIXATION (ORIF) CLAVICULAR FRACTURE Procedure Note  JACKQUELYN SUNDBERG female 61 y.o. 07/15/2011  Procedure(s) and Anesthesia Type:    * OPEN REDUCTION INTERNAL FIXATION (ORIF) CLAVICULAR FRACTURE - Choice  Surgeon(s) and Role:    * Mable Paris, MD - Primary   Indications:  61 y.o. female s/p fall from a horse with right comminuted displaced clavicle fracture. Indicated for surgery to promote anatomic restoration anatomy, improve functional outcome and avoid skin complications.     Surgeon: Mable Paris   Assistants: Damita Lack PA-S.  Anesthesia: General endotracheal anesthesia    Procedure Detail  OPEN REDUCTION INTERNAL FIXATION (ORIF) CLAVICULAR FRACTURE  Findings: Near-anatomic reduction with excellent fixation with a precontoured Acumed locking plate with 3 screws medial and 3 screws lateral to the fracture. One 2.7 mm interfragmentary screw was used for the largest butterfly fragment.  Estimated Blood Loss:  less than 50 mL         Drains: none  Blood Given: none         Specimens: none        Complications:  * No complications entered in OR log *         Disposition: PACU - hemodynamically stable.         Condition: stable    Procedure:   INDICATIONS FOR SURGERY: The patient suffered a displaced clavicle fracture and is  indicated for surgical treatment to promote anatomic alignment, prevent nonunion, and prevent deformity and subsequent weakness and disability. We have had an extensive discussion of risks, benefits, and alternatives of procedure including, but not limited to  risk of bleeding, infection, damage to neurovascular structures, risk of  nonunion, malunion, potential need for future hardware removal or  possible revision surgery. The patient understands this and wishes to proceed with surgery.   DESCRIPTION OF PROCEDURE: The patient was identified in preoperative  holding area  where I personally marked the operative site after  verifying site, side, and procedure with the patient. The patient was taken back  to the operating room where general anesthesia was induced without  complication and was placed in the beach-chair position with the back  elevated about 40 degrees and all extremities carefully padded and  positioned. The neck was turned very slightly away from the operative field  to assist in exposure. The right upper extremity was then prepped and  draped in a standard sterile fashion. The appropriate time-out  procedure was carried out. The patient did receive IV antibiotics  within 30 minutes of incision.  An incision was made in [employer] centered over the fracture site. Dissection was carried down through subcutaneous tissues and medial and lateral skin flaps were elevated.  The deltotrapezial fascia was then opened over the clavicle and the  medial and lateral fracture fragments were carefully exposed, taking great care to protect underlying neurovascular structures.  One small and one large Butterfly fragments were kept in continuity with soft tissue as to not disrupt blood supply.  One interfragmentary lag screw was used with a 2.7 mm screw. The screw was oriented anterior to posterior and got excellent fixation. This fixed the largest butterfly fragment to the lateral segment. The plate was positioned on the bone using fluoroscopic imaging to verify position. Locking and non locking screws were then used to fill the plate and flouroscopic imaging demonstrated appropriate position and screw lengths.  The wound was copiously irrigated with normal saline and the deltotrapezial fascia was  then carefully closed  over the construct with #0 vicryl sutures in  interrupted fashion. The skin was then closed with 2-0 Vicryl in a deep  dermal layer, 4-0 Monocryl for skin closure. Steri-Strips were applied.  10 mL of 0.25% Marcaine without epinephrine were  infiltrated for  postoperative pain. Sterile dressings were applied including a medium  Mepilex dressing. The patient was then allowed to awaken from general  anesthesia, placed in a sling, transferred to stretcher and taken to the  recovery room in stable condition.   POSTOPERATIVE PLAN: she will be kept overnight for pain control and  antibiotics, and will likely be discharged in the morning in a sling.  She will remain in the sling for about 4 weeks postoperatively with the  elbow, wrist, and hand motion only, and then will advance her shoulder  motion once there is some healing seen on x-ray.

## 2011-07-15 NOTE — Transfer of Care (Signed)
Immediate Anesthesia Transfer of Care Note  Patient: [name]  Procedure(s) Performed: Procedure(s) (LRB): OPEN REDUCTION INTERNAL FIXATION (ORIF) CLAVICULAR FRACTURE (Right)  Patient Location: PACU  Anesthesia Type: General  Level of Consciousness: awake, alert  and oriented  Airway & Oxygen Therapy: Patient Spontanous Breathing and Patient connected to face mask oxygen  Post-op Assessment: Report given to PACU RN and Post -op Vital signs reviewed and stable  Post vital signs: Reviewed and stable  Complications: No apparent anesthesia complications

## 2011-07-15 NOTE — Anesthesia Postprocedure Evaluation (Signed)
Anesthesia Post Note  Patient: [name]  Procedure(s) Performed: Procedure(s) (LRB): OPEN REDUCTION INTERNAL FIXATION (ORIF) CLAVICULAR FRACTURE (Right)  Anesthesia type: general  Patient location: PACU  Post pain: Pain level controlled  Post assessment: Patient's Cardiovascular Status Stable  Last Vitals:  Filed Vitals:   07/15/11 1545  BP: 143/59  Pulse: 81  Temp:   Resp: 17    Post vital signs: Reviewed and stable  Level of consciousness: sedated  Complications: No apparent anesthesia complications

## 2011-07-16 MED ORDER — OXYCODONE-ACETAMINOPHEN 5-325 MG PO TABS: 1.0000 | ORAL_TABLET | ORAL | Status: AC | PRN

## 2011-07-16 DEATH — deceased

## 2011-07-21 NOTE — Discharge Summary (Signed)
Patient ID: [name] MRN: [id number] DOB/AGE: 59/14/1952 59 y.o.  Admit date: 07/15/2011 Discharge date: 07/21/2011  Admission Diagnoses:  R comminuted clavicle fx  Discharge Diagnoses:  Same  Past Medical History  Diagnosis Date  . Perimenopausal   . Myocarditis, unspecified   . Unspecified disease of pericardium   . Pericardial effusion   . Pericarditis   . Headache   . Plantar fasciitis of right foot   . Allergic sinusitis   . Pericarditis     history 2006 from virus    Surgeries: Procedure(s): OPEN REDUCTION INTERNAL FIXATION (ORIF) CLAVICULAR FRACTURE on 07/15/2011 - 07/16/2011   Consultants:  none Discharged Condition: Improved  Hospital Course: [name] is an 61 y.o. female who was admitted 07/15/2011 for operative treatment of r comminuted clavicle fx. the patient was taken to the operating room on 07/15/2011 - 07/16/2011 and underwent  Procedure(s): OPEN REDUCTION INTERNAL FIXATION (ORIF) CLAVICULAR FRACTURE.    Patient was given perioperative antibiotics:  Anti-infectives     Start     Dose/Rate Route Frequency Ordered Stop   07/15/11 1800   ceFAZolin (ANCEF) IVPB 1 g/50 mL premix        1 g 100 mL/hr over 30 Minutes Intravenous Every 6 hours 07/15/11 1747 07/16/11 0700   07/15/11 1230   ceFAZolin (ANCEF) IVPB 1 g/50 mL premix        1 g 100 mL/hr over 30 Minutes Intravenous  Once 07/15/11 1226 07/15/11 1241           Patient was given sequential compression devices, early ambulation, to prevent DVT.  Patient benefited maximally from hospital stay and there were no complications.  Pain was well controlled.  Recent vital signs: No data found.    Recent laboratory studies: No results found for this basename: WBC:2,HGB:2,HCT:2,PLT:2,NA:2,K:2,CL:2,CO2:2,BUN:2,CREATININE:2,GLUCOSE:2,PT:2,INR:2,CALCIUM,2: in the last 72 hours   Discharge Medications:   Medication List  As of 07/21/2011 11:06 AM   TAKE these medications         CALCIUM 500 +D PO   Take 1 tablet by mouth daily. 1 tab daily      DSS 100 MG Caps   Take 100 mg by mouth 2 (two) times daily.      estrogens (conjugated) 0.625 MG tablet   Commonly known as: PREMARIN   Take 0.625 mg by mouth daily.      FISH OIL BURP-LESS PO   Take 1 tablet by mouth daily.      hydrochlorothiazide 25 MG tablet   Commonly known as: HYDRODIURIL   Take 25 mg by mouth daily.      methocarbamol 500 MG tablet   Commonly known as: ROBAXIN   Take 1 tablet (500 mg total) by mouth 3 (three) times daily.      oxyCODONE 5 MG immediate release tablet   Commonly known as: Oxy IR/ROXICODONE   Take 1 tablet (5 mg total) by mouth every 4 (four) hours as needed (5mg  for mild pain, 10mg  for moderate pain, 15mg  for severe pain).      oxyCODONE-acetaminophen 5-325 MG per tablet   Commonly known as: PERCOCET   Take 1-2 tablets by mouth every 4 (four) hours as needed for pain.      SUMAtriptan 25 MG tablet   Commonly known as: IMITREX   Take 25 mg by mouth every 2 (two) hours as needed. FOR MIGRAINES      traMADol 50 MG tablet   Commonly known as: ULTRAM   Take 2 tablets (100  mg total) by mouth every 6 (six) hours.            Diagnostic Studies: Dg Ribs Unilateral W/chest Right  07/01/2011  *RADIOLOGY REPORT*  Clinical Data: Fall from horse  RIGHT RIBS AND CHEST - 3+ VIEW  Comparison: 05/20/2005  Findings: There are multiple right-sided posterior and anterolateral rib fractures involving at least the right second, third, fourth, fifth, sixth, seventh ribs posteriorly, and at least ribs two through six anterolaterally.  Right lateral pleural effusion/thickening noted.  There is a midshaft right clavicular fracture.  Diffuse right hemithoracic volume loss is present.  No pneumothorax is identified, although given the extent of osseous injury, this could be obscured given high clinical suspicion for this finding.  Right breast biopsy clip incidentally noted.  Right lower lobe granuloma again noted.   Left lung is clear.  Heart size normal.  IMPRESSION: Multiple right-sided rib fractures involving at least the second through seventh ribs posteriorly and anterolaterally, compatible with flail chest type injury.  Mid shaft right clavicular fracture.  Original Report Authenticated By: Harrel Lemon, M.D.   Dg Cervical Spine Complete  07/01/2011  *RADIOLOGY REPORT*  Clinical Data: Thrown from horse.  Clavicle injury.  Rib fractures.  CERVICAL SPINE - COMPLETE 4+ VIEW  Comparison: Chest radiograph 07/01/2011 and shoulder radiograph 07/01/2011  Findings: The cervical spine is normally aligned from the skull base through the cervicothoracic junction.  C1 and C2 vertebral bodies are aligned and the dens appears intact.  No acute cervical spine fracture is identified.  Multilevel facet joint degenerative changes are present.  Bony neural foraminal narrowing is suspected at multiple levels bilaterally.  The prevertebral soft tissue contour is normal.  Right midshaft clavicle fracture is priors partially imaged. Several upper right-sided rib fractures are noted.  Small right apical pneumothorax is again seen.  IMPRESSION:  1.  No evidence of acute bony injury to the cervical spine. 2.  Small apical pneumothorax, as recently described on shoulder radiographs. 3.  Right clavicle and several upper right rib fractures visualized, as previously described on chest and shoulder radiographs.  Original Report Authenticated By: Britta Mccreedy, M.D.   Dg Clavicle Right  07/15/2011  *RADIOLOGY REPORT*  Clinical Data: Right clavicular fracture  RIGHT CLAVICLE - 2+ VIEWS  Comparison: July 01, 2011  Findings: The right clavicle is anatomically aligned after orthopedic fixation. The center screw is a right angle to the plate. The [facility] joint remains intact. Underlying rib fractures are again noted.  IMPRESSION: Anatomic alignment of right clavicular fracture after orthopedic plating.  Original Report Authenticated By: Brandon Melnick,  M.D.   Dg Clavicle Right  07/01/2011  *RADIOLOGY REPORT*  Clinical Data: Right sided chest pain after being thrown from horse  RIGHT CLAVICLE - 2+ VIEWS  Comparison: Chest radiograph same date  Findings: Mid shaft right clavicular fracture with butterfly fracture fragment is not as well seen as on the prior dissimilar exam.  Right-sided rib fractures are partly visualized.  On these projections, a small right apical pneumothorax is better visualized due to differences in technique.  IMPRESSION: Right mid shaft clavicular fracture again noted.  Small right apical pneumothorax is now identified, likely better visualized given technique of the current exam.  Multiple right-sided rib fractures partly visualized, previously reported.  Original Report Authenticated By: Harrel Lemon, M.D.   Dg Chest Port 1 View  07/15/2011  *RADIOLOGY REPORT*  Clinical Data: Preoperative study.  Assess for potential small pneumothorax noted on prior examination.  PORTABLE  CHEST - 1 VIEW  Comparison: Multiple priors, most recently 07/04/2011.  Findings: Again noted is post-traumatic deformity of multiple upper right ribs with mild displaced fractures of the lateral and anterior aspect of the right second, third, fourth and fifth ribs (i.e., a flail chest segment), as well as a displaced right clavicular fracture (distal fracture fragment is inferiorly displaced).  There is again a pleural based opacity in the periphery of the right hemithorax which is of uncertain etiology and significance, but is likely to represent some subpleural hemorrhage (i.e., hemorrhage between the pleura and the overlying ribs). Small dependent right-sided pleural effusion is present.  No definite right-sided pneumothorax is identified on today's examination.  Left lung appears well aerated, with exception of a linear opacity at the base, most compatible with an area of subsegmental atelectasis.  Pulmonary vasculature is normal. Cardiomediastinal  silhouette is within normal limits.  IMPRESSION: 1.  Extensive post-traumatic deformity in the upper right hemithorax redemonstrated, as above.  No definite pneumothorax identified on today's examination. 2.  Probable subpleural hemorrhage associated with the flail chest segment in the upper right hemithorax redemonstrated. 3.  Small right-sided pleural effusion.  Original Report Authenticated By: Florencia Reasons, M.D.   Dg Chest Port 1 View  07/04/2011  *RADIOLOGY REPORT*  Clinical Data: Rib pain and rib fractures.  PORTABLE CHEST - 1 VIEW  Comparison: 07/03/2011 and 07/01/2011  Findings: Again noted is a comminuted right clavicle fracture. There are multiple right rib fractures.  There are increased densities at the right lung base concerning for pleural fluid or hemothorax.  Again noted are left basilar densities suggesting pleural fluid and atelectasis.  Heart and mediastinum are stable.  IMPRESSION: Increased right basilar densities suggesting increased right pleural fluid or hemothorax.  Left basilar atelectasis and left pleural fluid.  Right rib fractures and right clavicle fracture.  Original Report Authenticated By: Richarda Overlie, M.D.   Dg Chest Port 1 View  07/03/2011  *RADIOLOGY REPORT*  Clinical Data: Rib fractures.  PORTABLE CHEST - 1 VIEW  Comparison: 07/02/2011  Findings: Increasing bibasilar atelectasis.  Small bilateral effusions.  Heart is borderline in size.  Multiple right rib fractures are again noted.  No pneumothorax.  IMPRESSION: Slight increased bibasilar atelectasis.  Small bilateral effusions.  Original Report Authenticated By: Cyndie Chime, M.D.   Dg Chest Port 1 View  07/02/2011  *RADIOLOGY REPORT*  Clinical Data: Chest pain.  Rib fracture and pneumothorax.  PORTABLE CHEST - 1 VIEW  Comparison: 07/01/2011.  Findings: Numerous displaced right-sided rib fractures are again noted, some appearing to be segmental.  Adjacent loculated pleural fluid or extrapleural hematoma appears  stable.  There is no pneumothorax.  There are lower lung volumes with increased bibasilar atelectasis.  Heart size and mediastinal contours are stable.  IMPRESSION: Worsened bibasilar aeration.  No pneumothorax identified.  Original Report Authenticated By: Gerrianne Scale, M.D.   Dg C-arm [phone] Min  07/15/2011  *RADIOLOGY REPORT*  Clinical Data: Plating of right clavicular fracture  C-ARM 61-120 MIN  Comparison: None.  Findings: The right clavicle is anatomically aligned after orthopedic fixation.  The center screw is a right angle to the plate.  The [employer] joint remains intact.  IMPRESSION: Anatomic alignment of right clavicular fracture after orthopedic plating.  Original Report Authenticated By: Brandon Melnick, M.D.    Disposition: 01-Home or Self Care  Discharge Orders    Future Orders Please Complete By Expires   Diet - low sodium heart healthy  Diet - low sodium heart healthy      Call MD / Call 911      Comments:   If you experience chest pain or shortness of breath, CALL 911 and be transported to the hospital emergency room.  If you develope a fever above 101 F, pus ([place] drainage) or increased drainage or redness at the wound, or calf pain, call your surgeon's office.   Constipation Prevention      Comments:   Drink plenty of fluids.  Prune juice may be helpful.  You may use a stool softener, such as Colace (over the counter) 100 mg twice a day.  Use MiraLax (over the counter) for constipation as needed.   Increase activity slowly as tolerated      Driving restrictions      Comments:   No driving for 4 weeks   Call MD / Call 911      Comments:   If you experience chest pain or shortness of breath, CALL 911 and be transported to the hospital emergency room.  If you develope a fever above 101 F, pus ([place] drainage) or increased drainage or redness at the wound, or calf pain, call your surgeon's office.   Constipation Prevention      Comments:   Drink plenty of fluids.  Prune  juice may be helpful.  You may use a stool softener, such as Colace (over the counter) 100 mg twice a day.  Use MiraLax (over the counter) for constipation as needed.   Increase activity slowly as tolerated      Driving restrictions      Comments:   No driving for 4 weeks      Follow-up Information    Follow up in 2 weeks.          Signed: Mable Paris 07/21/2011, 11:06 AM

## 2011-07-22 ENCOUNTER — Encounter (HOSPITAL_BASED_OUTPATIENT_CLINIC_OR_DEPARTMENT_OTHER): Payer: Self-pay | Admitting: Orthopedic Surgery

## 2013-01-21 ENCOUNTER — Other Ambulatory Visit: Payer: Self-pay | Admitting: Gastroenterology

## 2013-01-21 DIAGNOSIS — K6389 Other specified diseases of intestine: Secondary | ICD-10-CM

## 2013-01-21 DIAGNOSIS — C801 Malignant (primary) neoplasm, unspecified: Secondary | ICD-10-CM

## 2013-01-21 HISTORY — DX: Malignant (primary) neoplasm, unspecified: C80.1

## 2013-01-24 ENCOUNTER — Ambulatory Visit
Admission: RE | Admit: 2013-01-24 | Discharge: 2013-01-24 | Disposition: A | Discharge status: Home / Self Care | Payer: BC Managed Care – PPO | Source: Ambulatory Visit | Attending: Gastroenterology | Admitting: Gastroenterology

## 2013-01-24 DIAGNOSIS — K6389 Other specified diseases of intestine: Secondary | ICD-10-CM

## 2013-01-24 MED ORDER — IOHEXOL 300 MG/ML  SOLN
100.0000 mL | Freq: Once | INTRAMUSCULAR | Status: AC | PRN
  Administered 2013-01-24: 100 mL via INTRAVENOUS

## 2013-01-26 ENCOUNTER — Telehealth: Payer: Self-pay | Admitting: *Deleted

## 2013-01-26 NOTE — Telephone Encounter (Signed)
Referral received from Dr. Fransico Michael.  Dr. Truett Perna is reviewing for appointment.

## 2013-01-26 NOTE — Telephone Encounter (Signed)
Spoke with patient by phone. Confirmed appointments with Dr. Truett Perna and Dr. Mitzi Hansen. Contact names, phone numbers, and directions were provided to patient. Patient exptressed appreciation for the call and was without questions.

## 2013-01-27 ENCOUNTER — Telehealth: Payer: Self-pay | Admitting: Oncology

## 2013-01-27 NOTE — Telephone Encounter (Signed)
C/D 01/27/13 for appt. 01/31/13

## 2013-01-31 ENCOUNTER — Ambulatory Visit (HOSPITAL_BASED_OUTPATIENT_CLINIC_OR_DEPARTMENT_OTHER): Payer: BC Managed Care – PPO | Admitting: Oncology

## 2013-01-31 ENCOUNTER — Ambulatory Visit: Payer: BC Managed Care – PPO

## 2013-01-31 ENCOUNTER — Telehealth: Payer: Self-pay | Admitting: Oncology

## 2013-01-31 ENCOUNTER — Encounter: Payer: Self-pay | Admitting: Oncology

## 2013-01-31 ENCOUNTER — Other Ambulatory Visit: Payer: Self-pay | Admitting: *Deleted

## 2013-01-31 VITALS — BP 153/69 | HR 64 | Temp 98.3°F | Resp 18 | Ht 63.0 in | Wt 131.6 lb

## 2013-01-31 DIAGNOSIS — C211 Malignant neoplasm of anal canal: Secondary | ICD-10-CM

## 2013-01-31 DIAGNOSIS — C210 Malignant neoplasm of anus, unspecified: Secondary | ICD-10-CM

## 2013-01-31 DIAGNOSIS — C20 Malignant neoplasm of rectum: Secondary | ICD-10-CM

## 2013-01-31 NOTE — Progress Notes (Signed)
Met with Richrd Prime and family. Explained role of nurse navigator. Educational information provided on anal cancer   [facility] resources provided to patient, including SW service information.  Contact names and phone numbers were provided for entire [facility] team.  Referral made to dietician for diet education and to PT for outpatient prehabilitation per order of Dr. Truett Perna.  Teach back method was used.  No barriers to care identified.  Will continue to follow as needed.

## 2013-01-31 NOTE — Progress Notes (Signed)
Checked in new pt with no financial concerns. °

## 2013-01-31 NOTE — Progress Notes (Signed)
[name]   Referring MD: Ja Pistole 62 y.o.  1950-03-27    Reason for Referral: Anal cancer     HPI: She reports rectal pain for several months. She was evaluated by her gynecologist and noted to have a rectal abnormality. She was referred to Dr. Watt Climes and was taken to a colonoscopy procedure on 01/21/2013. A benign appearing polyp was found in the rectum. The polyp was biopsied. An ulcerated partially obstructing mass was found in the rectum. The mass was partially circumferential. Biopsies were taken. The exam was otherwise unremarkable the pathology ([id number]) confirmed a squamous cell carcinoma of the rectum. The rectum polyp biopsy revealed a tubular adenoma.  She was referred for CTs of the abdomen and pelvis on 01/24/2013. A lobulated dome of the right liver mass measured 1.9 cm and is most compatible with a cyst. The liver is otherwise unremarkable. No ascites. Focal asymmetric masslike wall thickening was noted at the left posterior lateral rectum with surrounding stranding. A 4 mm left perirectal lymph node and a 2 mm right perirectal lymph node were identified. The uterus and ovaries are surgically absent. A left internal iliac chain lymph node measured 6 mm.    Past Medical History  Diagnosis Date  .  G1 P0, one miscarriage    . Myocarditis, unspecified  2006   . Pericarditis, restrictive   . Pericardial effusion   . Pericarditis   .  history of endometriosis    . Plantar fasciitis of right foot   . Allergic sinusitis   . Pericarditis     history 2006 from virus   .   Thrombocytopenia treated with steroids in 1990   .    Migraine headaches  Past Surgical History  Procedure Laterality Date  . Pericardial window on november 11,2006, by dr bartle    . Other surgical history      post hysterectomy 1991  . Abdominal hysterectomy    . Colonoscopy   01/21/2013   . Orif clavicular fracture  07/15/2011   Procedure: OPEN REDUCTION INTERNAL FIXATION (ORIF) CLAVICULAR FRACTURE;  Surgeon: Nita Sells, MD;  Location: [facility];  Service: Orthopedics;  Laterality: Right;    Family History  Problem Relation Age of Onset  . Coronary artery disease Sister     stented at age 59   .Father-bladder cancer         . Mother-GBM                                                                                                                           92  . Brother-GBM  44  . Sister-  Melanoma                                                                  . Paternal aunt-breast cancer                                                                                                   31  . Maternal aunt-pancreas cancer                                                                                              68                                                                 Current outpatient prescriptions:Cholecalciferol 10000 UNITS CAPS, Take 1,000 Units by mouth daily., Disp: , Rfl: ;  Cyanocobalamin 1500 MCG TBDP, Take 1,500 mcg by mouth daily., Disp: , Rfl: ;  estrogens, conjugated, (PREMARIN) 0.625 MG tablet, Take 0.625 mg by mouth daily.  , Disp: , Rfl: ;  hydrochlorothiazide (MICROZIDE) 12.5 MG capsule, Take 12.5 mg by mouth daily., Disp: , Rfl:  naproxen sodium (ANAPROX) 220 MG tablet, Take 220 mg by mouth 2 (two) times daily as needed., Disp: , Rfl: ;  Omega-3 Fatty Acids (FISH OIL BURP-LESS PO), Take 1 tablet by mouth daily.  , Disp: , Rfl: ;  SUMAtriptan (IMITREX) 25 MG tablet, Take 25 mg by mouth every 2 (two) hours as needed. FOR MIGRAINES, Disp: , Rfl:   Allergies:  Allergies  Allergen Reactions  . Penicillins Itching and Rash    Social History:  She lives with her husband in [place], she is a retired Patent examiner. She does not use  tobacco or alcohol. No transfusion history. No risk factor for HIV or hepatitis. She was exposed to asbestos in the school.   ROS:   Positives include: occasional migraine headaches, rectal pain in certain positions  A complete ROS was otherwise negative.  Physical Exam:  Blood pressure 153/69, pulse 64, temperature 98.3 F (36.8 C), temperature source Oral, resp. rate 18, height 5\' 3"  (1.6 m), weight 131 lb 9.6 oz (59.693 kg).  HEENT:  Oropharynx without visible mass, neck without mass Lungs:  Clear bilaterally Cardiac:  Regular rate and rhythm  Abdomen:  No hepatosplenomegaly, nontender  Vascular:  No leg edema Lymph nodes:  No cervical, supraclavicular, axillary, or inguinal nodes Neurologic:  Alert and oriented, the motor exam appears intact in the upper and lower extremities Skin:  No rash Musculoskeletal:  No spine tenderness rectal: No visible mass at the anal verge. Approximate 2 cm proximal to the anal verge there is a mass occupying the posterior rectum.   LAB:  CBC  01/21/2013-hemolytic 12.5, MCV 92.3, Cipollone count 3.2, platelets 180,000, ANC 1.7, absolute lymphocyte count 1.0  CMP    01/21/2013-creatinine 0.73, calcium 8.9, albumin 3.7, bilirubin 0.9, alkaline phosphatase 41, AST 18, ALT 12  01/21/2013-CEA 1.9  Radiology: as per history of present illness    Assessment/Plan:   1. 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 Pericarditis-2006  3. 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 Status post TAH/BSO  5. Family history of multiple cancers   Disposition:   [name] was diagnosed with squamous cell carcinoma upon biopsy of a distal rectal mass. She appears to have squamous cell carcinoma of the distal rectum/proximal anal canal. There is no clinical evidence of distant metastatic disease.  Small indeterminate perirectal/pelvic lymph nodes were seen on a staging CT.  [name] is scheduled to see Dr. Lisbeth Renshaw on 02/02/2013 to discuss radiation therapy. I recommend treatment with concurrent chemotherapy and radiation.  We discussed the specifics of the 5-fluorouracil/mitomycin-C chemotherapy regimen. She understands the potential for nausea/vomiting, mucositis, alopecia, and hematologic toxicity. We discussed the rash, hyperpigmentation, hand/foot syndrome, and diarrhea associated with 5-fluorouracil. We reviewed the hemolytic uremic syndrome seen with mitomycin-C. She will attend a chemotherapy teaching class. We discussed the likelihood of skin breakdown at the groin/perineum with the combined therapy.  [name] is scheduled for a first cycle of chemotherapy on 02/14/2013. She will return for an office visit and nadir CBC on 02/24/2013.  We referred her to the pelvic physical therapy program for a pretreatment evaluation. She will undergo placement of a PICC prior to chemotherapy on 02/14/2013. She will also be referred to the genetics screening clinic, though I think it is unlikely her cancer is related to a familial syndrome.  Approximately 50 minutes were spent with the patient and her husband today. The majority of the time was used for counseling and coordination of care. [place], Rosalia 01/31/2013, 6:03 PM

## 2013-01-31 NOTE — Telephone Encounter (Signed)
worked 11/17 POF Melissa added chemo since MW gone for day AVS and cal given to pt shh

## 2013-02-01 ENCOUNTER — Other Ambulatory Visit: Payer: Self-pay | Admitting: *Deleted

## 2013-02-01 ENCOUNTER — Telehealth (HOSPITAL_COMMUNITY): Payer: Self-pay | Admitting: Radiology

## 2013-02-01 ENCOUNTER — Telehealth: Payer: Self-pay | Admitting: *Deleted

## 2013-02-01 ENCOUNTER — Encounter: Payer: Self-pay | Admitting: Radiation Oncology

## 2013-02-01 DIAGNOSIS — C211 Malignant neoplasm of anal canal: Secondary | ICD-10-CM

## 2013-02-01 NOTE — Telephone Encounter (Signed)
Referral to Genetics Counselor placed per request of Dr. Truett Perna.  POF sent to scheduler.

## 2013-02-01 NOTE — Progress Notes (Signed)
GU Location of Tumor / Histology: Rectal cancer  Patient presented  months ago with signs/symptoms of: rectal pain for months  Biopsies of   Diagnosis 01/21/13: 1. Rectum, biopsy- SQUAMOUS CELL CARCINOMA.- SEE COMMENT.2. Rectum, polyp(s)- TUBULAR ADENOMA.- HIGH GRADE DYSPLASIA IS NOT IDENTIFIED.  Past/Anticipated interventions by urology, if any: no  Past/Anticipated interventions by medical oncology, if any:  Dr. Benay Spice saw patient 01/31/13, scheduled 1st cycle  5-FU chemotherapy 02/14/13 f/u visit , cbc 02/24/13  Weight changes, if any: no  Bowel/Bladder complaints, if any: no, regular bowel movements, no bleeding or discharge   Nausea/Vomiting, if any: no  Pain issues, if any:  none  SAFETY ISSUES:  Prior radiation?no  Pacemaker/ICD? no  Possible current pregnancy? no  Is the patient on methotrexate? no  Current Complaints / other details:   Married, G1,PO, 1 miscarriage,myocarditis 2006,pericarditis,hx 2006 from virus , pericardial window 01/25/05,Dr.Bartle, ,endometriosis,migraines, hysterectomy  1991,ORIF clavicular fracture, Sister CAD,stent age 39, mother  GBM 92,& father GBM =55, sister Melanoma,. Paternal aunt breast cancer, maternal aunt=pancreatic cancer

## 2013-02-01 NOTE — Telephone Encounter (Signed)
Called pt to schedule for PICC.  Arrive at [facility] Friday 11-28 for 0800 appt.  No special instructions for this exam, ok to eat and drive home.

## 2013-02-02 ENCOUNTER — Telehealth: Payer: Self-pay | Admitting: *Deleted

## 2013-02-02 ENCOUNTER — Ambulatory Visit
Admission: RE | Admit: 2013-02-02 | Discharge: 2013-02-02 | Disposition: A | Discharge status: Home / Self Care | Payer: BC Managed Care – PPO | Source: Ambulatory Visit | Attending: Radiation Oncology | Admitting: Radiation Oncology

## 2013-02-02 ENCOUNTER — Telehealth: Payer: Self-pay | Admitting: Oncology

## 2013-02-02 ENCOUNTER — Encounter: Payer: Self-pay | Admitting: Radiation Oncology

## 2013-02-02 VITALS — BP 155/94 | HR 72 | Temp 97.8°F | Resp 20 | Ht 63.0 in | Wt 133.2 lb

## 2013-02-02 DIAGNOSIS — C20 Malignant neoplasm of rectum: Secondary | ICD-10-CM | POA: Insufficient documentation

## 2013-02-02 DIAGNOSIS — C211 Malignant neoplasm of anal canal: Secondary | ICD-10-CM

## 2013-02-02 DIAGNOSIS — C210 Malignant neoplasm of anus, unspecified: Secondary | ICD-10-CM | POA: Insufficient documentation

## 2013-02-02 HISTORY — DX: Malignant (primary) neoplasm, unspecified: C80.1

## 2013-02-02 NOTE — Progress Notes (Signed)
Please see the Nurse Progress Note in the MD Initial Consult Encounter for this patient. 

## 2013-02-02 NOTE — Telephone Encounter (Signed)
Called patient to inform of test, lvm for a return call 

## 2013-02-02 NOTE — Telephone Encounter (Signed)
lvm for pt regarding to Feb 2015 appt in genetics.

## 2013-02-03 ENCOUNTER — Other Ambulatory Visit: Payer: BC Managed Care – PPO

## 2013-02-03 ENCOUNTER — Other Ambulatory Visit: Payer: Self-pay | Admitting: *Deleted

## 2013-02-03 ENCOUNTER — Other Ambulatory Visit (HOSPITAL_BASED_OUTPATIENT_CLINIC_OR_DEPARTMENT_OTHER): Payer: BC Managed Care – PPO | Admitting: Lab

## 2013-02-03 ENCOUNTER — Encounter: Payer: Self-pay | Admitting: *Deleted

## 2013-02-03 ENCOUNTER — Telehealth: Payer: Self-pay | Admitting: Oncology

## 2013-02-03 ENCOUNTER — Ambulatory Visit
Admission: RE | Admit: 2013-02-03 | Discharge: 2013-02-03 | Disposition: A | Discharge status: Home / Self Care | Payer: BC Managed Care – PPO | Source: Ambulatory Visit | Attending: Radiation Oncology | Admitting: Radiation Oncology

## 2013-02-03 DIAGNOSIS — C211 Malignant neoplasm of anal canal: Secondary | ICD-10-CM

## 2013-02-03 DIAGNOSIS — C20 Malignant neoplasm of rectum: Secondary | ICD-10-CM

## 2013-02-03 DIAGNOSIS — C210 Malignant neoplasm of anus, unspecified: Secondary | ICD-10-CM

## 2013-02-03 DIAGNOSIS — R300 Dysuria: Secondary | ICD-10-CM | POA: Insufficient documentation

## 2013-02-03 DIAGNOSIS — K6289 Other specified diseases of anus and rectum: Secondary | ICD-10-CM | POA: Insufficient documentation

## 2013-02-03 DIAGNOSIS — R197 Diarrhea, unspecified: Secondary | ICD-10-CM | POA: Insufficient documentation

## 2013-02-03 DIAGNOSIS — Z510 Encounter for antineoplastic radiation therapy: Secondary | ICD-10-CM | POA: Insufficient documentation

## 2013-02-03 DIAGNOSIS — R630 Anorexia: Secondary | ICD-10-CM | POA: Insufficient documentation

## 2013-02-03 LAB — CBC WITH DIFFERENTIAL/PLATELET
Eosinophils Absolute: 0.1 10*3/uL (ref 0.0–0.5)
HCT: 40.9 % (ref 34.8–46.6)
LYMPH%: 26.3 % (ref 14.0–49.7)
MONO#: 0.5 10*3/uL (ref 0.1–0.9)
NEUT#: 2.7 10*3/uL (ref 1.5–6.5)
NEUT%: 59.7 % (ref 38.4–76.8)
Platelets: 239 10*3/uL (ref 145–400)
RBC: 4.45 10*6/uL (ref 3.70–5.45)
WBC: 4.5 10*3/uL (ref 3.9–10.3)

## 2013-02-03 LAB — HIV ANTIBODY (ROUTINE TESTING W REFLEX): HIV: NONREACTIVE

## 2013-02-03 MED ORDER — PROCHLORPERAZINE MALEATE 10 MG PO TABS: 10.0000 mg | ORAL_TABLET | Freq: Four times a day (QID) | ORAL | Status: DC | PRN

## 2013-02-03 NOTE — Telephone Encounter (Signed)
[name] in IR moved PICC from 11/28 to 12/8...still waiting for MW to move chemo shh

## 2013-02-03 NOTE — Telephone Encounter (Signed)
LVMM for IR and MW to rs PICC and chemo both to 12/8 Per Nurse Felicita Gage working on getting a  POF.  shh

## 2013-02-03 NOTE — Progress Notes (Signed)
Radiation Oncology         [phone] ________________________________  Name: [name] MRN: [id number]  Date: 02/02/2013  DOB: [birth year]  WU:[id number],Y S, MD  Magod, Lavada Mesi, MD     REFERRING PHYSICIAN: Ewing Schlein Lavada Mesi, MD   DIAGNOSIS: The primary encounter diagnosis was Rectal cancer. A diagnosis of Cancer of anal canal was also pertinent to this visit.   HISTORY OF PRESENT ILLNESS::[name] is a 62 y.o. female who is seen for an initial consultation visit. The patient notes that she has had some rectal discomfort for several months. This was something that she noticed predominantly when she exercised on an exercise bike. This progressed to having some discomfort at other times as well. The patient discussed this with her gynecologist who noted a rectal abnormality and she was referred to Dr. Ewing Schlein   for further evaluation. A benign appearing polyp was found in the rectum as well as an ulcerated partially obstructing mass within the rectum as well. This was partially circumferential and biopsies were taken. These have returned positive for squamous cell carcinoma. The rectal polyp returned positive for a tubular adenoma.  The patient has undergone CT scans of the abdomen and pelvis. There was focal left lateral abnormal rectal wall thickening which was felt to correspond to the colonoscopy findings. There was a potentially abnormal 4 mm lymph node near the rectum and more superiorly a 6 mm internal iliac chain lymph node. These were felt to possibly represent intrapelvic positive lymph nodes. There is no evidence of distant intra-abdominal metastatic disease.  The patient's case was discussed at multidisciplinary GI conference. 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    PREVIOUS RADIATION THERAPY: No   PAST MEDICAL HISTORY:  has a past medical  history of Perimenopausal; Myocarditis, unspecified; Unspecified disease of pericardium; Pericardial effusion; Pericarditis; Headache(784.0); Plantar fasciitis of right foot; Allergic sinusitis; Pericarditis; and Cancer (01/21/13).     PAST SURGICAL HISTORY: Past Surgical History  Procedure Laterality Date  . Pericardial window on november 11,2006, by dr bartle    . Other surgical history      post hysterectomy 1991  . Abdominal hysterectomy    . Colonoscopy    . Orif clavicular fracture  07/15/2011    Procedure: OPEN REDUCTION INTERNAL FIXATION (ORIF) CLAVICULAR FRACTURE;  Surgeon: Mable Paris, MD;  Location: [facility];  Service: Orthopedics;  Laterality: Right;     FAMILY HISTORY: family history includes Cancer in her father; Cancer (age of onset: 38) in her sister; Cancer (age of onset: 10) in her maternal aunt; Cancer (age of onset: 39) in her paternal aunt; Coronary artery disease in her sister.   SOCIAL HISTORY:  reports that she has never smoked. She does not have any smokeless tobacco history on file. She reports that she drinks alcohol. She reports that she does not use illicit drugs.   ALLERGIES: Penicillins   MEDICATIONS:  Current Outpatient Prescriptions  Medication Sig Dispense Refill  . Cholecalciferol 10000 UNITS CAPS Take 1,000 Units by mouth daily.      . Cyanocobalamin 1500 MCG TBDP Take 1,500 mcg by mouth daily.      Marland Kitchen estrogens, conjugated, (PREMARIN) 0.625 MG tablet Take 0.625 mg by mouth daily.        . hydrochlorothiazide (MICROZIDE) 12.5 MG capsule Take 12.5 mg by mouth daily.      Marland Kitchen  naproxen sodium (ANAPROX) 220 MG tablet Take 220 mg by mouth 2 (two) times daily as needed.      . Omega-3 Fatty Acids (FISH OIL BURP-LESS PO) Take 1 tablet by mouth daily.        . SUMAtriptan (IMITREX) 25 MG tablet Take 25 mg by mouth every 2 (two) hours as needed. FOR MIGRAINES       No current facility-administered medications for this encounter.       REVIEW OF SYSTEMS:  A 15 point review of systems is documented in the electronic medical record. This was obtained by the nursing staff. However, I reviewed this with the patient to discuss relevant findings and make appropriate changes.  Pertinent items are noted in HPI.    PHYSICAL EXAM:  height is 5\' 3"  (1.6 m) and weight is 133 lb 3.2 oz (60.419 kg). Her oral temperature is 97.8 F (36.6 C). Her blood pressure is 155/94 and her pulse is 72. Her respiration is 20.   General: Well-developed, in no acute distress HEENT: Normocephalic, atraumatic; oral cavity clear Neck: Supple without any lymphadenopathy Cardiovascular: Regular rate and rhythm Respiratory: Clear to auscultation bilaterally GI: Soft, nontender, normal bowel sounds Extremities: No edema present Neuro: No focal deficits Rectal:   Thickening is present in the posterior rectum approximately 2 cm from the anal verge. No blood on exam glove. No external lesions.     LABORATORY DATA:  Lab Results  Component Value Date   WBC 8.0 07/02/2011   HGB 12.4 07/15/2011   HCT 37.0 07/02/2011   MCV 92.3 07/02/2011   PLT 207 07/02/2011   Lab Results  Component Value Date   NA 140 07/02/2011   K 3.7 07/02/2011   CL 105 07/02/2011   CO2 28 07/02/2011   No results found for this basename: ALT, AST, GGT, ALKPHOS, BILITOT      RADIOGRAPHY: Ct Abdomen Pelvis W Contrast  01/24/2013   CLINICAL DATA:  Colonic mass at colonoscopy. Review of the PATHOLOGY in the electronic medical record indicates rectal biopsy 01/21/2013 demonstrating squamous cell carcinoma and a tubular adenoma within a rectal polyp. Status post total hysterectomy. Multiple traumatic fractures sustained in a fall from a horse 18 month ago.  EXAM: CT ABDOMEN AND PELVIS WITH CONTRAST  TECHNIQUE: Multidetector CT imaging of the abdomen and pelvis was performed using the standard protocol following bolus administration of intravenous contrast.  CONTRAST:  OMNIPAQUE  IOHEXOL 300 MG/ML  SOLN  COMPARISON:  No similar prior exam is available at this institution for comparison or on [facility].  FINDINGS: Minimal left lower lobe curvilinear scarring or atelectasis.  There is a lobulated dome of anterior segment right hepatic lobe mass image 12 measuring 1.9 cm and internal fluid density most compatible with a lobulated cyst or cluster of cysts. The liver is otherwise unremarkable. Gallbladder, adrenal glands, spleen, pancreas, and adrenal glands are unremarkable. 1.2 cm left upper renal pole cortical cyst. 2 mm too small to characterize right mid renal cortical hypodense lesion image 38.  No ascites, lymphadenopathy, or free air. The appendix and small bowel are normal in appearance. There is focal eccentric masslike wall thickening of the left posterior lateral aspect of the rectum with surrounding stranding, with the masslike portion measuring 2.6 x 2.2 cm image 72. 4 mm left perirectal lymph node identified image 65 and 2 mm right perirectal node identified image 70. Uterus and ovaries are surgically absent. The bladder is normal. More superiorly, there is a left internal  iliac chain lymph node measuring 6 mm image 61.  No lytic or sclerotic osseous lesion or acute osseous abnormality.  IMPRESSION: Focal left lateral abnormal rectal wall thickening, likely corresponding to the mass seen at recent colonoscopy. Abnormal perirectal 4 mm lymph node and more superiorly, 6 mm internal iliac chain lymph node, are identified which may reflect intrapelvic metastatic disease.  No evidence for distant intra-abdominal metastatic disease.   Electronically Signed   By: Christiana Pellant M.D.   On: 01/24/2013 17:00       IMPRESSION: The patient has a diagnosis of squamous cell carcinoma of the anus. Staging studies at this time reveal a likely T2, N0, M0 tumor, possibly involving low-volume regional disease with some subcentimeter lymph nodes noted on the CT scan of the abdomen/pelvis.  Further workup includes a PET scan which I would like to order to help stage the patient and which would help with treatment planning as well.  Notable aspects of the case include limited symptoms currently, some occasional pelvic discomfort.  Based on the current information available, the patient appears to be an appropriate candidate for definitve chemoradiotherapy. I therefore discussed a potential 5 1/2 - 6 week course of radiation with the patient. We discussed the rationale of this treatment, including the expected potential benefit. We also discussed the potential side effects and risks of such a treatment as well. All of her questions were answered.   PLAN: Simulation in the near future such that we can proceed with treatment planning. At this time, I anticipate beginning the patient's treatment on 02/21/2013 due to the fact that a PET scan will not be able to be completed until next week and also given the Thanksgiving holiday coming up next week as well..     I spent 60 minutes face to face with the patient and more than 50% of that time was spent in counseling and/or coordination of care.    ________________________________   Radene Gunning, MD, PhD

## 2013-02-04 ENCOUNTER — Telehealth: Payer: Self-pay | Admitting: *Deleted

## 2013-02-04 ENCOUNTER — Telehealth: Payer: Self-pay | Admitting: Oncology

## 2013-02-04 NOTE — Telephone Encounter (Signed)
sw pt made aware of changes in appts for IR and Tx being moved to 12/8 DEC JAN AND FEB calendars mailed to pt shh

## 2013-02-04 NOTE — Telephone Encounter (Signed)
Spoke with patient to review her schedule and answer any questions re: treatment.  Schedule was confirmed and questions were answered.  She expressed appreciation for the call.

## 2013-02-04 NOTE — Telephone Encounter (Signed)
Per staff message I have moved appt from 12/1 to 12/8

## 2013-02-09 ENCOUNTER — Encounter (HOSPITAL_COMMUNITY)
Admission: RE | Admit: 2013-02-09 | Discharge: 2013-02-09 | Disposition: A | Discharge status: Home / Self Care | Payer: BC Managed Care – PPO | Source: Ambulatory Visit | Attending: Radiation Oncology | Admitting: Radiation Oncology

## 2013-02-09 ENCOUNTER — Encounter (HOSPITAL_COMMUNITY): Payer: Self-pay

## 2013-02-09 DIAGNOSIS — C20 Malignant neoplasm of rectum: Secondary | ICD-10-CM | POA: Insufficient documentation

## 2013-02-09 MED ORDER — FLUDEOXYGLUCOSE F - 18 (FDG) INJECTION
18.6000 | Freq: Once | INTRAVENOUS | Status: AC | PRN
  Administered 2013-02-09: 18.6 via INTRAVENOUS

## 2013-02-11 ENCOUNTER — Other Ambulatory Visit (HOSPITAL_COMMUNITY): Payer: BC Managed Care – PPO

## 2013-02-14 ENCOUNTER — Ambulatory Visit: Payer: BC Managed Care – PPO | Admitting: Radiation Oncology

## 2013-02-14 ENCOUNTER — Ambulatory Visit: Payer: BC Managed Care – PPO

## 2013-02-15 ENCOUNTER — Ambulatory Visit: Payer: BC Managed Care – PPO

## 2013-02-16 ENCOUNTER — Ambulatory Visit: Payer: BC Managed Care – PPO | Attending: Oncology | Admitting: Physical Therapy

## 2013-02-16 ENCOUNTER — Ambulatory Visit: Payer: BC Managed Care – PPO

## 2013-02-16 DIAGNOSIS — K6289 Other specified diseases of anus and rectum: Secondary | ICD-10-CM | POA: Insufficient documentation

## 2013-02-16 DIAGNOSIS — M2420 Disorder of ligament, unspecified site: Secondary | ICD-10-CM | POA: Insufficient documentation

## 2013-02-16 DIAGNOSIS — R1031 Right lower quadrant pain: Secondary | ICD-10-CM | POA: Insufficient documentation

## 2013-02-16 DIAGNOSIS — IMO0001 Reserved for inherently not codable concepts without codable children: Secondary | ICD-10-CM | POA: Insufficient documentation

## 2013-02-16 DIAGNOSIS — M629 Disorder of muscle, unspecified: Secondary | ICD-10-CM | POA: Insufficient documentation

## 2013-02-17 ENCOUNTER — Ambulatory Visit: Payer: BC Managed Care – PPO

## 2013-02-18 ENCOUNTER — Telehealth: Payer: Self-pay | Admitting: Oncology

## 2013-02-18 ENCOUNTER — Ambulatory Visit: Payer: BC Managed Care – PPO

## 2013-02-18 ENCOUNTER — Other Ambulatory Visit: Payer: Self-pay | Admitting: *Deleted

## 2013-02-18 ENCOUNTER — Other Ambulatory Visit: Payer: Self-pay | Admitting: Pharmacist

## 2013-02-18 DIAGNOSIS — C211 Malignant neoplasm of anal canal: Secondary | ICD-10-CM

## 2013-02-18 NOTE — Progress Notes (Signed)
Pt needs CMET results prior to treatment 12/8. Request to scheduler for lab appt.

## 2013-02-18 NOTE — Telephone Encounter (Signed)
Lab added 12/8 before tx per 12/5 POF shh

## 2013-02-21 ENCOUNTER — Ambulatory Visit
Admission: RE | Admit: 2013-02-21 | Discharge: 2013-02-21 | Disposition: A | Discharge status: Home / Self Care | Payer: BC Managed Care – PPO | Source: Ambulatory Visit | Attending: Radiation Oncology | Admitting: Radiation Oncology

## 2013-02-21 ENCOUNTER — Other Ambulatory Visit: Payer: Self-pay | Admitting: Oncology

## 2013-02-21 ENCOUNTER — Ambulatory Visit (HOSPITAL_COMMUNITY)
Admission: RE | Admit: 2013-02-21 | Discharge: 2013-02-21 | Disposition: A | Discharge status: Home / Self Care | Payer: BC Managed Care – PPO | Source: Ambulatory Visit | Attending: Oncology | Admitting: Oncology

## 2013-02-21 ENCOUNTER — Other Ambulatory Visit: Payer: Self-pay | Admitting: *Deleted

## 2013-02-21 ENCOUNTER — Ambulatory Visit (HOSPITAL_BASED_OUTPATIENT_CLINIC_OR_DEPARTMENT_OTHER): Payer: BC Managed Care – PPO

## 2013-02-21 ENCOUNTER — Ambulatory Visit: Payer: BC Managed Care – PPO | Admitting: Radiation Oncology

## 2013-02-21 ENCOUNTER — Other Ambulatory Visit (HOSPITAL_BASED_OUTPATIENT_CLINIC_OR_DEPARTMENT_OTHER): Payer: BC Managed Care – PPO

## 2013-02-21 DIAGNOSIS — C20 Malignant neoplasm of rectum: Secondary | ICD-10-CM

## 2013-02-21 DIAGNOSIS — C211 Malignant neoplasm of anal canal: Secondary | ICD-10-CM

## 2013-02-21 DIAGNOSIS — C210 Malignant neoplasm of anus, unspecified: Secondary | ICD-10-CM

## 2013-02-21 DIAGNOSIS — Z5111 Encounter for antineoplastic chemotherapy: Secondary | ICD-10-CM

## 2013-02-21 LAB — COMPREHENSIVE METABOLIC PANEL (CC13)
Albumin: 3.6 g/dL (ref 3.5–5.0)
Alkaline Phosphatase: 57 U/L (ref 40–150)
Anion Gap: 10 mEq/L (ref 3–11)
BUN: 13.5 mg/dL (ref 7.0–26.0)
CO2: 28 mEq/L (ref 22–29)
Calcium: 9 mg/dL (ref 8.4–10.4)
Chloride: 104 mEq/L (ref 98–109)
Glucose: 87 mg/dl (ref 70–140)
Potassium: 3.6 mEq/L (ref 3.5–5.1)

## 2013-02-21 LAB — CBC WITH DIFFERENTIAL/PLATELET
Basophils Absolute: 0 10*3/uL (ref 0.0–0.1)
Eosinophils Absolute: 0.1 10*3/uL (ref 0.0–0.5)
HGB: 13 g/dL (ref 11.6–15.9)
MCV: 91.7 fL (ref 79.5–101.0)
MONO#: 0.4 10*3/uL (ref 0.1–0.9)
MONO%: 8.3 % (ref 0.0–14.0)
NEUT#: 2.6 10*3/uL (ref 1.5–6.5)
RDW: 13 % (ref 11.2–14.5)

## 2013-02-21 MED ORDER — SODIUM CHLORIDE 0.9 % IV SOLN
1000.0000 mg/m2/d | INTRAVENOUS | Status: DC
  Administered 2013-02-21: 6500 mg via INTRAVENOUS
  Filled 2013-02-21: qty 130

## 2013-02-21 MED ORDER — HEPARIN SOD (PORK) LOCK FLUSH 100 UNIT/ML IV SOLN
500.0000 [IU] | Freq: Once | INTRAVENOUS | Status: DC | PRN
  Filled 2013-02-21: qty 5

## 2013-02-21 MED ORDER — ONDANSETRON 8 MG/50ML IVPB (CHCC)
8.0000 mg | Freq: Once | INTRAVENOUS | Status: AC
  Administered 2013-02-21: 8 mg via INTRAVENOUS

## 2013-02-21 MED ORDER — SODIUM CHLORIDE 0.9 % IJ SOLN
10.0000 mL | INTRAMUSCULAR | Status: DC | PRN
  Filled 2013-02-21: qty 10

## 2013-02-21 MED ORDER — SODIUM CHLORIDE 0.9 % IV SOLN
Freq: Once | INTRAVENOUS | Status: AC
  Administered 2013-02-21: 17:00:00 via INTRAVENOUS

## 2013-02-21 MED ORDER — DEXAMETHASONE SODIUM PHOSPHATE 10 MG/ML IJ SOLN
10.0000 mg | Freq: Once | INTRAMUSCULAR | Status: AC
  Administered 2013-02-21: 10 mg via INTRAVENOUS

## 2013-02-21 MED ORDER — LIDOCAINE HCL 1 % IJ SOLN
INTRAMUSCULAR | Status: AC
  Filled 2013-02-21: qty 20

## 2013-02-21 MED ORDER — DEXAMETHASONE SODIUM PHOSPHATE 10 MG/ML IJ SOLN
INTRAMUSCULAR | Status: AC
  Filled 2013-02-21: qty 1

## 2013-02-21 MED ORDER — MITOMYCIN CHEMO IV INJECTION 20 MG
8.0000 mg/m2 | Freq: Once | INTRAVENOUS | Status: AC
  Administered 2013-02-21: 13 mg via INTRAVENOUS
  Filled 2013-02-21: qty 26

## 2013-02-21 MED ORDER — ONDANSETRON 8 MG/NS 50 ML IVPB
INTRAVENOUS | Status: AC
  Filled 2013-02-21: qty 8

## 2013-02-21 MED ORDER — PROCHLORPERAZINE MALEATE 10 MG PO TABS: 10.0000 mg | ORAL_TABLET | Freq: Four times a day (QID) | ORAL | Status: DC | PRN

## 2013-02-21 NOTE — Patient Instructions (Addendum)
[facility] Discharge Instructions for Patients Receiving Chemotherapy  Today you received the following chemotherapy agents Mitomycin and Fluorouracil (5 FU)  To help prevent nausea and vomiting after your treatment, we encourage you to take your nausea medication as directed.   Compazine (prochloraperzine) every 6 hours as needed for any nausea and/or vomiting.    If you develop nausea and vomiting that is not controlled by your nausea medication, call the clinic.   BELOW ARE SYMPTOMS THAT SHOULD BE REPORTED IMMEDIATELY:  *FEVER GREATER THAN 100.5 F  *CHILLS WITH OR WITHOUT FEVER  NAUSEA AND VOMITING THAT IS NOT CONTROLLED WITH YOUR NAUSEA MEDICATION  *UNUSUAL SHORTNESS OF BREATH  *UNUSUAL BRUISING OR BLEEDING  TENDERNESS IN MOUTH AND THROAT WITH OR WITHOUT PRESENCE OF ULCERS  *URINARY PROBLEMS  *BOWEL PROBLEMS  UNUSUAL RASH Items with * indicate a potential emergency and should be followed up as soon as possible.  Feel free to call the clinic you have any questions or concerns. The clinic phone number is [phone].   Mitomycin injection What is this medicine? MITOMYCIN (mye toe MYE sin) is a chemotherapy drug. This medicine is used to treat cancer of the stomach and pancreas. This medicine may be used for other purposes; ask your health care provider or pharmacist if you have questions. COMMON BRAND NAME(S): Mutamycin What should I tell my health care provider before I take this medicine? They need to know if you have any of these conditions: -anemia -bleeding disorder -infection (especially a virus infection such as chickenpox, cold sores, or herpes) -kidney disease -low blood counts like low platelets, red blood cells, Resetar blood cells -recent radiation therapy -an unusual or allergic reaction to mitomycin, other chemotherapy agents, other medicines, foods, dyes, or preservatives -pregnant or trying to get pregnant -breast-feeding How should I  use this medicine? This drug is given as an injection or infusion into a vein. It is administered in a hospital or clinic by a specially trained health care professional. Talk to your pediatrician regarding the use of this medicine in children. Special care may be needed. Overdosage: If you think you have taken too much of this medicine contact a poison control center or emergency room at once. NOTE: This medicine is only for you. Do not share this medicine with others. What if I miss a dose? It is important not to miss your dose. Call your doctor or health care professional if you are unable to keep an appointment. What may interact with this medicine? -medicines to increase blood counts like filgrastim, pegfilgrastim, sargramostim -vaccines This list may not describe all possible interactions. Give your health care provider a list of all the medicines, herbs, non-prescription drugs, or dietary supplements you use. Also tell them if you smoke, drink alcohol, or use illegal drugs. Some items may interact with your medicine. What should I watch for while using this medicine? Your condition will be monitored carefully while you are receiving this medicine. You will need important blood work done while you are taking this medicine. This drug may make you feel generally unwell. This is not uncommon, as chemotherapy can affect healthy cells as well as cancer cells. Report any side effects. Continue your course of treatment even though you feel ill unless your doctor tells you to stop. Call your doctor or health care professional for advice if you get a fever, chills or sore throat, or other symptoms of a cold or flu. Do not treat yourself. This drug decreases your body's  ability to fight infections. Try to avoid being around people who are sick. This medicine may increase your risk to bruise or bleed. Call your doctor or health care professional if you notice any unusual bleeding. Be careful brushing and  flossing your teeth or using a toothpick because you may get an infection or bleed more easily. If you have any dental work done, tell your dentist you are receiving this medicine. Avoid taking products that contain aspirin, acetaminophen, ibuprofen, naproxen, or ketoprofen unless instructed by your doctor. These medicines may hide a fever. Do not become pregnant while taking this medicine. Women should inform their doctor if they wish to become pregnant or think they might be pregnant. There is a potential for serious side effects to an unborn child. Talk to your health care professional or pharmacist for more information. Do not breast-feed an infant while taking this medicine. What side effects may I notice from receiving this medicine? Side effects that you should report to your doctor or health care professional as soon as possible: -allergic reactions like skin rash, itching or hives, swelling of the face, lips, or tongue -low blood counts - this medicine may decrease the number of Strebeck blood cells, red blood cells and platelets. You may be at increased risk for infections and bleeding. -signs of infection - fever or chills, cough, sore throat, pain or difficulty passing urine -signs of decreased platelets or bleeding - bruising, pinpoint red spots on the skin, black, tarry stools, blood in the urine -signs of decreased red blood cells - unusually weak or tired, fainting spells, lightheadedness -breathing problems -changes in vision -chest pain -confusion -dry cough -high blood pressure -mouth sores -pain, swelling, redness at site where injected -pain, tingling, numbness in the hands or feet -seizures -swelling of the ankles, feet, hands -trouble passing urine or change in the amount of urine Side effects that usually do not require medical attention (report to your doctor or health care professional if they continue or are bothersome): -diarrhea -green to blue color of urine -hair  loss -loss of appetite -nausea, vomiting This list may not describe all possible side effects. Call your doctor for medical advice about side effects. You may report side effects to FDA at 1-800-FDA-1088. Where should I keep my medicine? This drug is given in a hospital or clinic and will not be stored at home. NOTE: This sheet is a summary. It may not cover all possible information. If you have questions about this medicine, talk to your doctor, pharmacist, or health care provider.  2014, Elsevier/Gold Standard. (2007-09-09 11:16:23)   Fluorouracil, 5-FU injection What is this medicine? FLUOROURACIL, 5-FU (flure oh YOOR a sil) is a chemotherapy drug. It slows the growth of cancer cells. This medicine is used to treat many types of cancer like breast cancer, colon or rectal cancer, pancreatic cancer, and stomach cancer. This medicine may be used for other purposes; ask your health care provider or pharmacist if you have questions. COMMON BRAND NAME(S): Adrucil What should I tell my health care provider before I take this medicine? They need to know if you have any of these conditions: -blood disorders -dihydropyrimidine dehydrogenase (DPD) deficiency -infection (especially a virus infection such as chickenpox, cold sores, or herpes) -kidney disease -liver disease -malnourished, poor nutrition -recent or ongoing radiation therapy -an unusual or allergic reaction to fluorouracil, other chemotherapy, other medicines, foods, dyes, or preservatives -pregnant or trying to get pregnant -breast-feeding How should I use this medicine? This drug is given  as an infusion or injection into a vein. It is administered in a hospital or clinic by a specially trained health care professional. Talk to your pediatrician regarding the use of this medicine in children. Special care may be needed. Overdosage: If you think you have taken too much of this medicine contact a poison control center or emergency  room at once. NOTE: This medicine is only for you. Do not share this medicine with others. What if I miss a dose? It is important not to miss your dose. Call your doctor or health care professional if you are unable to keep an appointment. What may interact with this medicine? -allopurinol -cimetidine -dapsone -digoxin -hydroxyurea -leucovorin -levamisole -medicines for seizures like ethotoin, fosphenytoin, phenytoin -medicines to increase blood counts like filgrastim, pegfilgrastim, sargramostim -medicines that treat or prevent blood clots like warfarin, enoxaparin, and dalteparin -methotrexate -metronidazole -pyrimethamine -some other chemotherapy drugs like busulfan, cisplatin, estramustine, vinblastine -trimethoprim -trimetrexate -vaccines Talk to your doctor or health care professional before taking any of these medicines: -acetaminophen -aspirin -ibuprofen -ketoprofen -naproxen This list may not describe all possible interactions. Give your health care provider a list of all the medicines, herbs, non-prescription drugs, or dietary supplements you use. Also tell them if you smoke, drink alcohol, or use illegal drugs. Some items may interact with your medicine. What should I watch for while using this medicine? Visit your doctor for checks on your progress. This drug may make you feel generally unwell. This is not uncommon, as chemotherapy can affect healthy cells as well as cancer cells. Report any side effects. Continue your course of treatment even though you feel ill unless your doctor tells you to stop. In some cases, you may be given additional medicines to help with side effects. Follow all directions for their use. Call your doctor or health care professional for advice if you get a fever, chills or sore throat, or other symptoms of a cold or flu. Do not treat yourself. This drug decreases your body's ability to fight infections. Try to avoid being around people who are  sick. This medicine may increase your risk to bruise or bleed. Call your doctor or health care professional if you notice any unusual bleeding. Be careful brushing and flossing your teeth or using a toothpick because you may get an infection or bleed more easily. If you have any dental work done, tell your dentist you are receiving this medicine. Avoid taking products that contain aspirin, acetaminophen, ibuprofen, naproxen, or ketoprofen unless instructed by your doctor. These medicines may hide a fever. Do not become pregnant while taking this medicine. Women should inform their doctor if they wish to become pregnant or think they might be pregnant. There is a potential for serious side effects to an unborn child. Talk to your health care professional or pharmacist for more information. Do not breast-feed an infant while taking this medicine. Men should inform their doctor if they wish to father a child. This medicine may lower sperm counts. Do not treat diarrhea with over the counter products. Contact your doctor if you have diarrhea that lasts more than 2 days or if it is severe and watery. This medicine can make you more sensitive to the sun. Keep out of the sun. If you cannot avoid being in the sun, wear protective clothing and use sunscreen. Do not use sun lamps or tanning beds/booths. What side effects may I notice from receiving this medicine? Side effects that you should report to your doctor or health  care professional as soon as possible: -allergic reactions like skin rash, itching or hives, swelling of the face, lips, or tongue -low blood counts - this medicine may decrease the number of Saulnier blood cells, red blood cells and platelets. You may be at increased risk for infections and bleeding. -signs of infection - fever or chills, cough, sore throat, pain or difficulty passing urine -signs of decreased platelets or bleeding - bruising, pinpoint red spots on the skin, black, tarry stools,  blood in the urine -signs of decreased red blood cells - unusually weak or tired, fainting spells, lightheadedness -breathing problems -changes in vision -chest pain -mouth sores -nausea and vomiting -pain, swelling, redness at site where injected -pain, tingling, numbness in the hands or feet -redness, swelling, or sores on hands or feet -stomach pain -unusual bleeding Side effects that usually do not require medical attention (report to your doctor or health care professional if they continue or are bothersome): -changes in finger or toe nails -diarrhea -dry or itchy skin -hair loss -headache -loss of appetite -sensitivity of eyes to the light -stomach upset -unusually teary eyes This list may not describe all possible side effects. Call your doctor for medical advice about side effects. You may report side effects to FDA at 1-800-FDA-1088. Where should I keep my medicine? This drug is given in a hospital or clinic and will not be stored at home. NOTE: This sheet is a summary. It may not cover all possible information. If you have questions about this medicine, talk to your doctor, pharmacist, or health care provider.  2014, Elsevier/Gold Standard. (2007-07-07 13:53:16)

## 2013-02-22 ENCOUNTER — Telehealth: Payer: Self-pay | Admitting: *Deleted

## 2013-02-22 ENCOUNTER — Ambulatory Visit (HOSPITAL_BASED_OUTPATIENT_CLINIC_OR_DEPARTMENT_OTHER): Payer: BC Managed Care – PPO

## 2013-02-22 ENCOUNTER — Ambulatory Visit
Admission: RE | Admit: 2013-02-22 | Discharge: 2013-02-22 | Disposition: A | Discharge status: Home / Self Care | Payer: BC Managed Care – PPO | Source: Ambulatory Visit | Attending: Radiation Oncology | Admitting: Radiation Oncology

## 2013-02-22 ENCOUNTER — Ambulatory Visit: Payer: BC Managed Care – PPO

## 2013-02-22 VITALS — BP 131/75 | HR 65 | Temp 97.6°F

## 2013-02-22 DIAGNOSIS — C210 Malignant neoplasm of anus, unspecified: Secondary | ICD-10-CM

## 2013-02-22 MED ORDER — SODIUM CHLORIDE 0.9 % IJ SOLN
10.0000 mL | Freq: Once | INTRAMUSCULAR | Status: DC
  Filled 2013-02-22: qty 10

## 2013-02-22 MED ORDER — HEPARIN SOD (PORK) LOCK FLUSH 100 UNIT/ML IV SOLN
250.0000 [IU] | Freq: Once | INTRAVENOUS | Status: DC
  Filled 2013-02-22: qty 5

## 2013-02-22 NOTE — Patient Instructions (Signed)
Peripherally Inserted Central Catheter (PICC) Home Guide A peripherally inserted central catheter (PICC) is a long, thin, flexible tube that is inserted into a vein in the upper arm. It is a form of intravenous (IV) access. It is considered to be a "central" line because the tip of the PICC ends in a large vein in your chest. This large vein is called the superior vena cava (SVC). The PICC tip ends in the SVC because there is a lot of blood flow in the SVC. This allows medicines and IV fluids to be quickly distributed throughout the body. The PICC is inserted using a sterile technique by a specially trained nurse or physician. After the PICC is inserted, a chest X-ray is done to be sure it is in the correct place.  A PICC may be placed for different reasons, such as:  To give medicines and liquid nutrition that can only be given through a central line. Examples are:  Certain antibiotic treatments.  Chemotherapy.  Total parenteral nutrition (TPN).  To take frequent blood samples.  To give IV fluids and blood products.  If there is difficulty placing a peripheral intravenous (PIV) catheter. If taken care of properly, a PICC can remain in place for several months. A PICC can also allow patients to go home early. Medicine and PICC care can be managed at home by a family member or home healthcare team. RISKS AND COMPLICATIONS Possible problems with a PICC can occasionally occur. This may include:  A clot (thrombus) forming in or at the tip of the PICC. This can cause the PICC to become clogged. A "clot-busting" medicine called tissue plasminogen activator (tPA) can be inserted into the PICC to help break up the clot.  Inflammation of the vein (phlebitis) in which the PICC is placed. Signs of inflammation may include redness, pain at the insertion site, red streaks, or being able to feel a "cord" in the vein where the PICC is located.  Infection in the PICC or at the insertion site. Signs of  infection may include fever, chills, redness, swelling, or pus drainage from the PICC insertion site.  PICC movement (malposition). The PICC tip may migrate from its original position due to excessive physical activity, forceful coughing, sneezing, or vomiting.  A break or cut in the PICC. It is important to not use scissors near the PICC.  Nerve or tendon irritation or injury during PICC insertion. HOME CARE INSTRUCTIONS Activity  You may bend your arm and move it freely. If your PICC is near or at the bend of your elbow, avoid activity with repeated motion at the elbow.  Avoid lifting heavy objects as instructed by your caregiver.  Avoid using a crutch with the arm on the same side as your PICC. You may need to use a walker. PICC Dressing  Keep your PICC bandage (dressing) clean and dry to prevent infection.  Ask your caregiver when you may shower. Ask your caregiver to teach you how to wrap the PICC when you do take a shower.  Do not bathe, swim, or use hot tubs when you have a PICC.  Change the PICC dressing as instructed by your caregiver.  Change your PICC dressing if it becomes loose or wet. General PICC Care  Check the PICC insertion site daily for leakage, redness, swelling, or pain.  Flush the PICC as directed by your caregiver. Let your caregiver know right away if the PICC is difficult to flush or does not flush. Do not use force   to flush the PICC.  Do not use a syringe that is less than 10 mLs to flush the PICC.  Never pull or tug on the PICC.  Avoid blood pressure checks on the arm with the PICC.  Keep your PICC identification card with you at all times.  Do not take the PICC out yourself. Only a trained clinical professional should remove the PICC. SEEK IMMEDIATE MEDICAL CARE IF:  Your PICC is accidently pulled all the way out. If this happens, cover the insertion site with a bandage or gauze dressing. Do not throw the PICC away. Your caregiver will need to  inspect it.  Your PICC was tugged or pulled and has partially come out. Do not  push the PICC back in.  There is any type of drainage, redness, or swelling where the PICC enters the skin.  You cannot flush the PICC, it is difficult to flush, or the PICC leaks around the insertion site when it is flushed.  You hear a "flushing" sound when the PICC is flushed.  You have pain, discomfort, or numbness in your arm, shoulder, or jaw on the same side as the PICC .  You feel your heart "racing" or skipping beats.  You notice a hole or tear in the PICC.  You develop chills or a fever. MAKE SURE YOU:   Understand these instructions.  Will watch your condition.  Will get help right away if you are not doing well or get worse. Document Released: 09/07/2002 Document Revised: 05/26/2011 Document Reviewed: 07/08/2010 Exit[employer] Patient Information 2014 [employer].  

## 2013-02-22 NOTE — Telephone Encounter (Signed)
Pt called reports "I have a headache this morning and I usually take Imitrex; it that OK to take?"  Pt had first treatment 02/22/13 and states she is doing well except headache.  Per Dr. Truett Perna, instructed pt OK to take Imitrex for headache.  Pt verbalized understanding and expressed appreciation.

## 2013-02-23 ENCOUNTER — Ambulatory Visit
Admission: RE | Admit: 2013-02-23 | Discharge: 2013-02-23 | Disposition: A | Discharge status: Home / Self Care | Payer: BC Managed Care – PPO | Source: Ambulatory Visit | Attending: Radiation Oncology | Admitting: Radiation Oncology

## 2013-02-23 ENCOUNTER — Ambulatory Visit: Payer: BC Managed Care – PPO

## 2013-02-23 DIAGNOSIS — C211 Malignant neoplasm of anal canal: Secondary | ICD-10-CM

## 2013-02-23 NOTE — Progress Notes (Signed)
Post sim ed completed w/pt and husband. Gave pt "Radiation and You" booklet w/all pertinent information marked and discussed, re: diarrhea and management, fatigue, hair loss, skin irritation/care, urinary problems/management, nutrition, pain. Pt has nutrition appointment tomorrow. All questions answered, teachback. Pt stated she did not want a sitz bath at this time. Printed updated calendar for pt.

## 2013-02-24 ENCOUNTER — Ambulatory Visit: Payer: BC Managed Care – PPO

## 2013-02-24 ENCOUNTER — Ambulatory Visit: Payer: BC Managed Care – PPO | Admitting: Nutrition

## 2013-02-24 ENCOUNTER — Encounter: Payer: Self-pay | Admitting: Gastroenterology

## 2013-02-24 ENCOUNTER — Ambulatory Visit
Admission: RE | Admit: 2013-02-24 | Discharge: 2013-02-24 | Disposition: A | Discharge status: Home / Self Care | Payer: BC Managed Care – PPO | Source: Ambulatory Visit | Attending: Radiation Oncology | Admitting: Radiation Oncology

## 2013-02-24 ENCOUNTER — Ambulatory Visit: Payer: BC Managed Care – PPO | Admitting: Physical Therapy

## 2013-02-24 ENCOUNTER — Other Ambulatory Visit: Payer: BC Managed Care – PPO

## 2013-02-24 ENCOUNTER — Ambulatory Visit: Payer: BC Managed Care – PPO | Admitting: Nurse Practitioner

## 2013-02-24 NOTE — Progress Notes (Signed)
62 year old female diagnosed with rectal/anal cancer receiving chemoradiation therapy.    Past medical history includes perimenopausal, myocarditis, pericardial effusion.  Medications include cholecalciferol, omega-3 fatty acids, Compazine, vitamin B12.  Labs were reviewed.  Height: 63 inches. Weight: 133.2 pounds. Usual body weight: 130 pounds. BMI: 23.6.  Patient has had one episode of nausea.  She took nausea medication and it resolved.  She denies mouth sores, vomiting, or diarrhea at this time.  She is eating frequently, but not as much at each meal or snack.  Weight is within usual body weight.  Nutrition diagnosis: Food and nutrition related knowledge deficit related to diagnosis of anal cancer and associated treatments as evidenced by no prior need for nutrition related information.  Intervention: Patient was educated on strategies for dealing with potential side effects from her chemoradiation therapy.  She was educated to consume small, frequent meals with higher protein foods at each meal and snack.  Encouraged patient to begin oral nutrition supplements if she noticed decreased intake resulted in weight loss.  Answered patient's questions.  Teach back method used.  Provided patient with fact sheets and contact information.  Monitoring, evaluation, goals: Patient will tolerate adequate calories and protein with minimal nutrition impact symptoms to promote weight maintenance.  Next visit: No followup required at this time.  Patient agrees to contact me with questions or concerns.

## 2013-02-25 ENCOUNTER — Telehealth: Payer: Self-pay | Admitting: Oncology

## 2013-02-25 ENCOUNTER — Ambulatory Visit
Admission: RE | Admit: 2013-02-25 | Discharge: 2013-02-25 | Disposition: A | Discharge status: Home / Self Care | Payer: BC Managed Care – PPO | Source: Ambulatory Visit | Attending: Radiation Oncology | Admitting: Radiation Oncology

## 2013-02-25 ENCOUNTER — Ambulatory Visit (HOSPITAL_BASED_OUTPATIENT_CLINIC_OR_DEPARTMENT_OTHER): Payer: BC Managed Care – PPO

## 2013-02-25 ENCOUNTER — Other Ambulatory Visit: Payer: Self-pay | Admitting: *Deleted

## 2013-02-25 ENCOUNTER — Ambulatory Visit: Payer: BC Managed Care – PPO

## 2013-02-25 VITALS — BP 114/69 | HR 76 | Temp 98.1°F

## 2013-02-25 VITALS — BP 109/73 | HR 98 | Temp 97.8°F | Ht 63.0 in | Wt 131.7 lb

## 2013-02-25 DIAGNOSIS — C211 Malignant neoplasm of anal canal: Secondary | ICD-10-CM

## 2013-02-25 DIAGNOSIS — C20 Malignant neoplasm of rectum: Secondary | ICD-10-CM

## 2013-02-25 NOTE — Telephone Encounter (Signed)
Called pt and left message regarding lab and ML for december 2014

## 2013-02-25 NOTE — Patient Instructions (Signed)
Peripherally Inserted Central Catheter (PICC) Removal and Care After A peripherally inserted catheter (PICC) is removed when it is no longer needed, when it is clotted, or when it may be infected.  PROCEDURE  The removal of a PICC is usually painless. Removing the tape that holds the PICC in place may be the most discomfort you have.  A physicians order needs to be obtained to have the PICC removed.  A PICC can be removed in the hospital or in an outpatient setting.  Never remove or take out the PICC yourself. Only a trained clinical professional, such as a PICC nurse, should remove the PICC.  If a PICC is suspected to be infected, the PICC tip is sent to the lab for culture. HOME CARE INSTRUCTIONS  When the PICC is out, pressure is applied at the insertion site to prevent bleeding. An antibiotic ointment may be applied to the insertion site. A dry, sterile gauze is then taped over the insertion site. This dressing should stay on for 24 hours.  After the 24 hours is up, the dressing may be removed. The PICC insertion site is very small. A small scab may develop over the insertion site. It is okay to wash the site gently with soap and water. Be careful to not remove or pick the scab off. After washing, gently pat the site dry. You do not need to put another dressing over the insertion site after you wash it.  Avoid heavy, strenuous physical activity for 24 hours after the PICC is removed. This includes things like:  Weight lifting.  Strenuous yard work.  Any physical activity with repetitive arm movement. SEEK MEDICAL CARE IF:  Call or see your caregiver as soon as possible if you develop the following conditions in the arm in which the PICC was inserted:  Swelling or puffiness.  Increasing tenderness or pain. SEEK IMMEDIATE MEDICAL CARE IF:  You develop any of the following conditions in the arm that had the PICC:  Numbness or tingling in your fingers, hand, or arm.  You arm has  a bluish color and it is cold to the touch.  Redness around the insertion site or a red-streak that goes up your arm.  Any type of drainage from the PICC insertion site. This includes drainage such as:  Bleeding from the insertion site. (If this happens, apply firm, direct pressure to the PICC insertion site with a clean towel.)  Drainage that is yellow or tan in color.  You have an oral temperature above 102 F (38.9 C), not controlled by medicine. Document Released: 08/21/2009 Document Revised: 05/26/2011 Document Reviewed: 08/21/2009 [employer] Patient Information 2014 [employer].  

## 2013-02-25 NOTE — Progress Notes (Signed)
   Department of Radiation Oncology  Phone:  [phone] Fax:        [phone]  Weekly Treatment Note    Name: [name] Date: 02/25/2013 MRN: [id number] DOB: [birth year]   Current dose: 9 Gy  Current fraction: 5   MEDICATIONS: Current Outpatient Prescriptions  Medication Sig Dispense Refill  . Cholecalciferol 10000 UNITS CAPS Take 1,000 Units by mouth daily.      . Cyanocobalamin 1500 MCG TBDP Take 1,500 mcg by mouth daily.      Marland Kitchen estrogens, conjugated, (PREMARIN) 0.625 MG tablet Take 0.625 mg by mouth daily.        . hydrochlorothiazide (MICROZIDE) 12.5 MG capsule Take 12.5 mg by mouth daily.      . naproxen sodium (ANAPROX) 220 MG tablet Take 220 mg by mouth 2 (two) times daily as needed.      . Omega-3 Fatty Acids (FISH OIL BURP-LESS PO) Take 1 tablet by mouth daily.        . prochlorperazine (COMPAZINE) 10 MG tablet Take 1 tablet (10 mg total) by mouth every 6 (six) hours as needed for nausea.  60 tablet  1  . SUMAtriptan (IMITREX) 25 MG tablet Take 25 mg by mouth every 2 (two) hours as needed. FOR MIGRAINES       No current facility-administered medications for this encounter.     ALLERGIES: Penicillins   LABORATORY DATA:  Lab Results  Component Value Date   WBC 4.3 02/21/2013   HGB 13.0 02/21/2013   HCT 38.9 02/21/2013   MCV 91.7 02/21/2013   PLT 246 02/21/2013   Lab Results  Component Value Date   NA 143 02/21/2013   K 3.6 02/21/2013   CL 105 07/02/2011   CO2 28 02/21/2013   Lab Results  Component Value Date   ALT 12 02/21/2013   AST 22 02/21/2013   ALKPHOS 57 02/21/2013   BILITOT 0.44 02/21/2013     NARRATIVE: [name] was seen today for weekly treatment management. The chart was checked and the patient's films were reviewed. The patient is doing well. No problems so before except for some nausea. She is taking Compazine medication for this which is working well.  PHYSICAL EXAMINATION: height is 5\' 3"  (1.6 m) and weight is 131 lb 11.2 oz (59.739  kg). Her temperature is 97.8 F (36.6 C). Her blood pressure is 109/73 and her pulse is 98. Her oxygen saturation is 100%.        ASSESSMENT: The patient is doing satisfactorily with treatment.  PLAN: We will continue with the patient's radiation treatment as planned.

## 2013-02-25 NOTE — Progress Notes (Signed)
[name] has had 5 fractions to her anal canal/pelvis.  She denies pain.  She does have fatigue.  She has been having nausea for the past two days.  She is taking compazine which makes her very drowsy.  She denies diarrhea.  She reports a change in her vision.  She says colors are brighter and she is having trouble with depth perception.

## 2013-02-25 NOTE — Progress Notes (Signed)
  Radiation Oncology         [phone] ________________________________  Name: Francesa P Milliron MRN: [id number]  Date: 02/03/2013  DOB: 09/18/1950  SIMULATION AND TREATMENT PLANNING NOTE   CONSENT VERIFIED: yes   SET UP: Patient is set-up supine   IMMOBILIZATION: The following immobilization is used: alpha-cradle. This complex treatment device will be used on a daily basis during the patient's treatment.   Diagnosis: Anal cancer   NARRATIVE: The patient was brought to the CT Simulation planning suite. Identity was confirmed. All relevant records and images related to the planned course of therapy were reviewed. Then, the patient was positioned in a stable reproducible clinical set-up for radiation therapy using an aquaplast mask. Skin markings were placed. The CT images were loaded into the planning software where the target and avoidance structures were contoured.The radiation prescription was entered and confirmed.   The patient will receive 50.4 Gy in 28 fractions to the high-dose target region.  Daily image guidance is ordered, and this will be used on a daily basis. This is necessary to ensure accurate and precise localization of the target in addition to accurate alignment of the normal tissue structures in this region. This is particularly important given the possible motion of the high-dose target.  Treatment planning then occurred.   I have requested : Intensity Modulated Radiotherapy (IMRT) is medically necessary for this case for the following reason: Dose homogeneity; the target is in close proximity to critical normal structures, including the femoral heads, bladder, and small bowel. IMRT is thus medically to appropriately treat the patient.   Special treatment procedure  The patient will receive chemotherapy during the course of radiation treatment. The patient may experience increased or overlapping toxicity due to this combined-modality approach and the patient will be  monitored for such problems. This may include extra lab work as necessary. This therefore constitutes a special treatment procedure.     ________________________________  Ellakate Gonsalves S. Jaleya Pebley, MD, PhD  

## 2013-02-25 NOTE — Addendum Note (Signed)
Encounter addended by: Jonna Coup, MD on: 02/25/2013  4:37 PM<BR>     Documentation filed: Notes Section, Visit Diagnoses

## 2013-02-25 NOTE — Progress Notes (Signed)
3.7 mL 22fu bolused.    Pt placed in supine position.  PICC line removed with patient holding breath.  34 cm catheter removed.  Pressure applied to site for 5 minutes.  Vaseline dressing and 4 x 4 gauze secured with Co-ban pressure dressing.    Pt remained for observation 30 minutes in supine position.  Discharged to home with care instructions.

## 2013-02-28 ENCOUNTER — Ambulatory Visit: Payer: BC Managed Care – PPO

## 2013-02-28 ENCOUNTER — Ambulatory Visit
Admission: RE | Admit: 2013-02-28 | Discharge: 2013-02-28 | Disposition: A | Discharge status: Home / Self Care | Payer: BC Managed Care – PPO | Source: Ambulatory Visit | Attending: Radiation Oncology | Admitting: Radiation Oncology

## 2013-03-01 ENCOUNTER — Ambulatory Visit
Admission: RE | Admit: 2013-03-01 | Discharge: 2013-03-01 | Disposition: A | Discharge status: Home / Self Care | Payer: BC Managed Care – PPO | Source: Ambulatory Visit | Attending: Radiation Oncology | Admitting: Radiation Oncology

## 2013-03-01 ENCOUNTER — Ambulatory Visit: Payer: BC Managed Care – PPO

## 2013-03-02 ENCOUNTER — Ambulatory Visit (HOSPITAL_BASED_OUTPATIENT_CLINIC_OR_DEPARTMENT_OTHER): Payer: BC Managed Care – PPO | Admitting: Nurse Practitioner

## 2013-03-02 ENCOUNTER — Ambulatory Visit: Payer: BC Managed Care – PPO

## 2013-03-02 ENCOUNTER — Other Ambulatory Visit (HOSPITAL_BASED_OUTPATIENT_CLINIC_OR_DEPARTMENT_OTHER): Payer: BC Managed Care – PPO

## 2013-03-02 ENCOUNTER — Ambulatory Visit
Admission: RE | Admit: 2013-03-02 | Discharge: 2013-03-02 | Disposition: A | Discharge status: Home / Self Care | Payer: BC Managed Care – PPO | Source: Ambulatory Visit | Attending: Radiation Oncology | Admitting: Radiation Oncology

## 2013-03-02 VITALS — BP 115/68 | HR 90 | Temp 97.9°F | Resp 18 | Ht 63.0 in | Wt 128.0 lb

## 2013-03-02 DIAGNOSIS — C211 Malignant neoplasm of anal canal: Secondary | ICD-10-CM

## 2013-03-02 DIAGNOSIS — C20 Malignant neoplasm of rectum: Secondary | ICD-10-CM

## 2013-03-02 DIAGNOSIS — T451X5A Adverse effect of antineoplastic and immunosuppressive drugs, initial encounter: Secondary | ICD-10-CM

## 2013-03-02 DIAGNOSIS — D6959 Other secondary thrombocytopenia: Secondary | ICD-10-CM

## 2013-03-02 LAB — BASIC METABOLIC PANEL (CC13)
Anion Gap: 7 mEq/L (ref 3–11)
CO2: 27 mEq/L (ref 22–29)
Calcium: 8.6 mg/dL (ref 8.4–10.4)
Creatinine: 0.7 mg/dL (ref 0.6–1.1)
Potassium: 3.9 mEq/L (ref 3.5–5.1)
Sodium: 137 mEq/L (ref 136–145)

## 2013-03-02 LAB — CBC WITH DIFFERENTIAL/PLATELET
Basophils Absolute: 0 10*3/uL (ref 0.0–0.1)
EOS%: 4.1 % (ref 0.0–7.0)
Eosinophils Absolute: 0.1 10*3/uL (ref 0.0–0.5)
HCT: 33.5 % — ABNORMAL LOW (ref 34.8–46.6)
HGB: 11.5 g/dL — ABNORMAL LOW (ref 11.6–15.9)
MCV: 88.2 fL (ref 79.5–101.0)
MONO#: 0.1 10*3/uL (ref 0.1–0.9)
MONO%: 6.7 % (ref 0.0–14.0)
NEUT#: 1.6 10*3/uL (ref 1.5–6.5)
RBC: 3.8 10*6/uL (ref 3.70–5.45)
RDW: 12.2 % (ref 11.2–14.5)
WBC: 2 10*3/uL — ABNORMAL LOW (ref 3.9–10.3)
lymph#: 0.2 10*3/uL — ABNORMAL LOW (ref 0.9–3.3)
nRBC: 0 % (ref 0–0)

## 2013-03-02 MED ORDER — MAGIC MOUTHWASH: 10.0000 mL | Freq: Four times a day (QID) | ORAL | Status: DC | PRN

## 2013-03-02 MED ORDER — TRAMADOL HCL 50 MG PO TABS: 50.0000 mg | ORAL_TABLET | Freq: Three times a day (TID) | ORAL | Status: DC | PRN

## 2013-03-02 NOTE — Progress Notes (Signed)
OFFICE PROGRESS NOTE  Interval history:  Ms. Barletta returns as scheduled. She began radiation and cycle 1 5-FU/mitomycin C. on 02/21/2013. She had mild nausea following the chemotherapy. The nausea was relieved with Compazine. No vomiting. She had loose stools yesterday and took some Imodium. Her tongue is "tender". She denies any mouth ulcers. She is tolerating fluids without difficulty but does note her throat is sore with swallowing.   Objective: Blood pressure 115/68, pulse 90, temperature 97.9 F (36.6 C), temperature source Oral, resp. rate 18, height 5\' 3"  (1.6 m), weight 128 lb (58.06 kg).  No thrush. Ulcerations noted at the left posterior palate and right lateral tongue. Lungs are clear. No wheezes or rales. Regular cardiac rhythm. Abdomen soft and nontender. No hepatomegaly. Extremities without edema. Perineum is erythematous. No skin breakdown.  Lab Results: Lab Results  Component Value Date   WBC 2.0* 03/02/2013   HGB 11.5* 03/02/2013   HCT 33.5* 03/02/2013   MCV 88.2 03/02/2013   PLT 117* 03/02/2013    Chemistry:    Chemistry      Component Value Date/Time   NA 137 03/02/2013 1359   NA 140 07/02/2011 0430   K 3.9 03/02/2013 1359   K 3.7 07/02/2011 0430   CL 105 07/02/2011 0430   CO2 27 03/02/2013 1359   CO2 28 07/02/2011 0430   BUN 12.0 03/02/2013 1359   BUN 14 07/02/2011 0430   CREATININE 0.7 03/02/2013 1359   CREATININE 0.68 07/02/2011 0430      Component Value Date/Time   CALCIUM 8.6 03/02/2013 1359   CALCIUM 8.6 07/02/2011 0430   ALKPHOS 57 02/21/2013 1543   AST 22 02/21/2013 1543   ALT 12 02/21/2013 1543   BILITOT 0.44 02/21/2013 1543       Studies/Results: Nm Pet Image Initial (pi) Skull Base To Thigh  02/09/2013   CLINICAL DATA:  Initial treatment strategy for rectal cancer.  EXAM: NUCLEAR MEDICINE PET SKULL BASE TO THIGH  FASTING BLOOD GLUCOSE:  Value: 80mg /dl  TECHNIQUE: 21.3 mCi Y-86 FDG was injected intravenously. CT data was obtained and used for  attenuation correction and anatomic localization only. (This was not acquired as a diagnostic CT examination.) Additional exam technical data entered on technologist worksheet.  COMPARISON:  CT of the abdomen and pelvis on 01/24/2013  FINDINGS: NECK  No hypermetabolic lymphadenopathy in the neck.  CHEST  No hypermetabolic mediastinal or hilar nodes. No suspicious pulmonary nodules on the CT scan.  Deformity of multiple right ribs is compatible with the history of previous right-sided rib fractures. Calcified lymph nodes are seen in the mediastinum and left hilum. There is a calcified granuloma in the right lower lobe.  ABDOMEN/PELVIS  No abnormal hypermetabolic activity within the liver, pancreas, adrenal glands, or spleen. No hypermetabolic lymph nodes in the abdomen or pelvis.  Stable appearance of the peripheral 1.9 cm low-density lesion in the dome of the liver. This shows no hypermetabolic activity on the PET images.  Prominent wall thickening in the distal rectum is compatible with the patient's known history of neoplasm. This region of the distal rectum is markedly hypermetabolic with SUV max = 22. Tiny perirectal lymph nodes are identified on the left, as seen on the previous diagnostic CT scan. There presence is concerning for metastatic disease. No hypermetabolism is evident on the PET scan, but given the tiny size of these lymph nodes, where likely below the threshold for resolution by PET imaging.  SKELETON  No focal hypermetabolic activity to suggest skeletal metastasis.  IMPRESSION: Markedly hypermetabolic distal rectal lesion, consistent with the patient's known primary malignancy. Tiny perirectal lymph nodes are abnormal and concerning for metastatic disease. Lack of demonstrable hypermetabolism within these tiny lymph nodes is likely secondary to their size.   Electronically Signed   By: Kennith Center M.D.   On: 02/09/2013 16:07   Ir Fluoro Guide Cv Line Right  02/21/2013   CLINICAL DATA:  Anal  cancer; PICC line for chemo  EXAM: RIGHT UPPER EXTREMITY APPROACH PICC LINE PLACEMENT WITH ULTRASOUND AND FLUOROSCOPIC GUIDANCE  FLUOROSCOPY TIME:  1 min  PROCEDURE: The patient was advised of the possible risks and complications and agreed to undergo the procedure. The patient was then brought to the angiographic suite for the procedure.  The right arm was prepped with chlorhexidine, draped in the usual sterile fashion using maximum barrier technique (cap and mask, sterile gown, sterile gloves, large sterile sheet, hand hygiene and cutaneous antisepsis) and infiltrated locally with 1% Lidocaine.  Ultrasound demonstrated patency of the right basilic vein, and this was documented with an image. Under real-time ultrasound guidance, this vein was accessed with a 21 gauge micropuncture needle and image documentation was performed. A 0.018 wire was introduced in to the vein. Over this, a 5 [place] double lumen power-injectable PICC was advanced to the lower SVC/right atrial junction. Fluoroscopy during the procedure and fluoro spot radiograph confirms appropriate catheter position. The catheter was flushed and covered with a sterile dressing.  Complications: None  IMPRESSION: Successful right arm Power PICC line placement with ultrasound and fluoroscopic guidance. The catheter is ready for use.  34 cm in length.  Read by:  Simonne Come PA-C   Electronically Signed   By: Simonne Come M.D.   On: 02/21/2013 14:39   Ir US Guide Vasc Access Right  02/21/2013   CLINICAL DATA:  Anal cancer; PICC line for chemo  EXAM: RIGHT UPPER EXTREMITY APPROACH PICC LINE PLACEMENT WITH ULTRASOUND AND FLUOROSCOPIC GUIDANCE  FLUOROSCOPY TIME:  1 min  PROCEDURE: The patient was advised of the possible risks and complications and agreed to undergo the procedure. The patient was then brought to the angiographic suite for the procedure.  The right arm was prepped with chlorhexidine, draped in the usual sterile fashion using maximum barrier  technique (cap and mask, sterile gown, sterile gloves, large sterile sheet, hand hygiene and cutaneous antisepsis) and infiltrated locally with 1% Lidocaine.  Ultrasound demonstrated patency of the right basilic vein, and this was documented with an image. Under real-time ultrasound guidance, this vein was accessed with a 21 gauge micropuncture needle and image documentation was performed. A 0.018 wire was introduced in to the vein. Over this, a 5 [place] double lumen power-injectable PICC was advanced to the lower SVC/right atrial junction. Fluoroscopy during the procedure and fluoro spot radiograph confirms appropriate catheter position. The catheter was flushed and covered with a sterile dressing.  Complications: None  IMPRESSION: Successful right arm Power PICC line placement with ultrasound and fluoroscopic guidance. The catheter is ready for use.  34 cm in length.  Read by:  Simonne Come PA-C   Electronically Signed   By: Simonne Come M.D.   On: 02/21/2013 14:39    Medications: I have reviewed the patient's current medications.  Assessment/Plan:  1. 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   Staging CT of the abdomen/pelvis 01/24/2013 revealed small perirectal lymph nodes and a 6 mm left internal iliac node.   Radiation and cycle 1  5-FU/mitomycin C. initiated on 02/21/2013. 2. Pericarditis 2006. 3. History of thrombocytopenia. Question ITP. 4. Status post TAH/BSO. 5. Family history multiple cancers. 6. Mucositis secondary to chemotherapy. 7. Mild leukopenia and thrombocytopenia secondary to chemotherapy.  Disposition-she appears stable. She has developed mucositis related to the chemotherapy. She is tolerating fluids without difficulty. She was given a prescription for Magic mouthwash 10 cc 4 times daily as needed and tramadol 50 mg every 8 hours as needed. She understands to contact the office if she has any difficulty tolerating fluids.  We  discussed cycle 2 5-FU/mitomycin-C beginning 03/21/2013. She prefers to begin cycle 2 on 03/28/2013 which will be the final week of radiation. We will schedule her for PICC placement the morning of 03/28/2013.  She will be seen prior to proceeding with treatment that day.  Lonna Cobb ANP/GNP-BC

## 2013-03-03 ENCOUNTER — Ambulatory Visit: Admission: RE | Admit: 2013-03-03 | Payer: BC Managed Care – PPO | Source: Ambulatory Visit

## 2013-03-03 ENCOUNTER — Ambulatory Visit: Payer: BC Managed Care – PPO

## 2013-03-03 ENCOUNTER — Ambulatory Visit: Payer: BC Managed Care – PPO | Admitting: Physical Therapy

## 2013-03-04 ENCOUNTER — Telehealth: Payer: Self-pay | Admitting: *Deleted

## 2013-03-04 ENCOUNTER — Encounter: Payer: Self-pay | Admitting: Radiation Oncology

## 2013-03-04 ENCOUNTER — Ambulatory Visit: Payer: BC Managed Care – PPO

## 2013-03-04 ENCOUNTER — Ambulatory Visit
Admission: RE | Admit: 2013-03-04 | Discharge: 2013-03-04 | Disposition: A | Discharge status: Home / Self Care | Payer: BC Managed Care – PPO | Source: Ambulatory Visit | Attending: Radiation Oncology | Admitting: Radiation Oncology

## 2013-03-04 VITALS — BP 120/69 | HR 84 | Temp 98.3°F | Resp 16 | Wt 128.3 lb

## 2013-03-04 DIAGNOSIS — C211 Malignant neoplasm of anal canal: Secondary | ICD-10-CM

## 2013-03-04 NOTE — Progress Notes (Signed)
Weekly rad tx 9/28 rectal  States in between rectum irritation and pain, , wants something for that area, diarrhea MOnday, took imodium resolved, does have mmouth ulcers, taking mmw and tramadol for pain, eating soups, drinking tea and plenty water 3:41 PM

## 2013-03-04 NOTE — Telephone Encounter (Signed)
sw pt gv appts for 03/28/13 w/ labs@ 11:15, ov@ 11:45am, and tx to follow. gv appt for pump d/c 04/01/12, and appts for 04/07/13 to have labs@ 9:30am and ov@ 10am. Pt is aware that tx will be added. i emailed MW to add the tx...td

## 2013-03-04 NOTE — Progress Notes (Signed)
   Department of Radiation Oncology  Phone:  [phone] Fax:        [phone]  Weekly Treatment Note    Name: [name] Date: 03/04/2013 MRN: [id number] DOB: Jul 19, 1950   Current dose: 16.2 Gy  Current fraction: 9   MEDICATIONS: Current Outpatient Prescriptions  Medication Sig Dispense Refill  . Alum & Mag Hydroxide-Simeth (MAGIC MOUTHWASH) SOLN Take 10 mLs by mouth 4 (four) times daily as needed for mouth pain.  240 mL  1  . Cholecalciferol 10000 UNITS CAPS Take 1,000 Units by mouth daily.      . Cyanocobalamin 1500 MCG TBDP Take 1,500 mcg by mouth daily.      Marland Kitchen estrogens, conjugated, (PREMARIN) 0.625 MG tablet Take 0.625 mg by mouth daily.        . hydrochlorothiazide (MICROZIDE) 12.5 MG capsule Take 12.5 mg by mouth daily.      . naproxen sodium (ANAPROX) 220 MG tablet Take 220 mg by mouth 2 (two) times daily as needed.      . Omega-3 Fatty Acids (FISH OIL BURP-LESS PO) Take 1 tablet by mouth daily.        . prochlorperazine (COMPAZINE) 10 MG tablet Take 1 tablet (10 mg total) by mouth every 6 (six) hours as needed for nausea.  60 tablet  1  . SUMAtriptan (IMITREX) 25 MG tablet Take 25 mg by mouth every 2 (two) hours as needed. FOR MIGRAINES      . traMADol (ULTRAM) 50 MG tablet Take 1 tablet (50 mg total) by mouth every 8 (eight) hours as needed. For pain  30 tablet  0   No current facility-administered medications for this encounter.     ALLERGIES: Penicillins   LABORATORY DATA:  Lab Results  Component Value Date   WBC 2.0* 03/02/2013   HGB 11.5* 03/02/2013   HCT 33.5* 03/02/2013   MCV 88.2 03/02/2013   PLT 117* 03/02/2013   Lab Results  Component Value Date   NA 137 03/02/2013   K 3.9 03/02/2013   CL 105 07/02/2011   CO2 27 03/02/2013   Lab Results  Component Value Date   ALT 12 02/21/2013   AST 22 02/21/2013   ALKPHOS 57 02/21/2013   BILITOT 0.44 02/21/2013     NARRATIVE: [name] was seen today for weekly treatment management. The chart  was checked and the patient's films were reviewed. The patient states that she is beginning to experience some early skin irritation in the anal region. She had some diarrhea earlier and took Imodium, now has not had a bowel movement for a couple of days.  PHYSICAL EXAMINATION: weight is 128 lb 4.8 oz (58.196 kg). Her oral temperature is 98.3 F (36.8 C). Her blood pressure is 120/69 and her pulse is 84. Her respiration is 16.      the patient's skin shows some early skin reaction with hyperpigmentation. No significant erythema. No desquamation.  ASSESSMENT: The patient is doing satisfactorily with treatment.  PLAN: We will continue with the patient's radiation treatment as planned. I discussed with the patient the expected skin reaction as we proceed. I don't believe that the patient really needs to use anything right now. We discussed that there is not anything that really prevents the skin irritation from occurring. We will manage this further as she proceeds on with treatment. She will use sitz baths as she feels necessary within the next week.

## 2013-03-04 NOTE — Telephone Encounter (Signed)
Per staff message and POF I have scheduled appts.  JMW  

## 2013-03-07 ENCOUNTER — Ambulatory Visit
Admission: RE | Admit: 2013-03-07 | Discharge: 2013-03-07 | Disposition: A | Discharge status: Home / Self Care | Payer: BC Managed Care – PPO | Source: Ambulatory Visit | Attending: Radiation Oncology | Admitting: Radiation Oncology

## 2013-03-07 ENCOUNTER — Ambulatory Visit: Payer: BC Managed Care – PPO

## 2013-03-08 ENCOUNTER — Ambulatory Visit: Payer: BC Managed Care – PPO

## 2013-03-08 ENCOUNTER — Ambulatory Visit
Admission: RE | Admit: 2013-03-08 | Discharge: 2013-03-08 | Disposition: A | Discharge status: Home / Self Care | Payer: BC Managed Care – PPO | Source: Ambulatory Visit | Attending: Radiation Oncology | Admitting: Radiation Oncology

## 2013-03-09 ENCOUNTER — Ambulatory Visit: Payer: BC Managed Care – PPO

## 2013-03-09 ENCOUNTER — Ambulatory Visit
Admission: RE | Admit: 2013-03-09 | Discharge: 2013-03-09 | Disposition: A | Discharge status: Home / Self Care | Payer: BC Managed Care – PPO | Source: Ambulatory Visit | Attending: Radiation Oncology | Admitting: Radiation Oncology

## 2013-03-11 ENCOUNTER — Ambulatory Visit: Payer: BC Managed Care – PPO

## 2013-03-11 ENCOUNTER — Ambulatory Visit
Admission: RE | Admit: 2013-03-11 | Discharge: 2013-03-11 | Disposition: A | Discharge status: Home / Self Care | Payer: BC Managed Care – PPO | Source: Ambulatory Visit | Attending: Radiation Oncology | Admitting: Radiation Oncology

## 2013-03-11 ENCOUNTER — Encounter: Payer: Self-pay | Admitting: Radiation Oncology

## 2013-03-11 VITALS — BP 120/70 | HR 70 | Temp 97.8°F | Resp 16 | Wt 130.3 lb

## 2013-03-11 DIAGNOSIS — C210 Malignant neoplasm of anus, unspecified: Secondary | ICD-10-CM | POA: Insufficient documentation

## 2013-03-11 DIAGNOSIS — C211 Malignant neoplasm of anal canal: Secondary | ICD-10-CM

## 2013-03-11 LAB — CBC WITH DIFFERENTIAL/PLATELET
Basophils Absolute: 0 10*3/uL (ref 0.0–0.1)
Eosinophils Absolute: 0.1 10*3/uL (ref 0.0–0.5)
HCT: 30.9 % — ABNORMAL LOW (ref 34.8–46.6)
LYMPH%: 12.4 % — ABNORMAL LOW (ref 14.0–49.7)
MCHC: 34.3 g/dL (ref 31.5–36.0)
MCV: 90.4 fL (ref 79.5–101.0)
MONO%: 22.4 % — ABNORMAL HIGH (ref 0.0–14.0)
NEUT#: 1.3 10*3/uL — ABNORMAL LOW (ref 1.5–6.5)
NEUT%: 59.7 % (ref 38.4–76.8)
Platelets: 145 10*3/uL (ref 145–400)
RBC: 3.42 10*6/uL — ABNORMAL LOW (ref 3.70–5.45)
WBC: 2.2 10*3/uL — ABNORMAL LOW (ref 3.9–10.3)
lymph#: 0.3 10*3/uL — ABNORMAL LOW (ref 0.9–3.3)

## 2013-03-11 NOTE — Progress Notes (Signed)
wekly rad txs anal 13 completed, c/o  bottom soreness, started having  Some bright red blood with each stool past couple days, irritated stated,  Eating well, no nausea, uses sitz baths 2-3x day along using baby wipes 3:35 PM

## 2013-03-11 NOTE — Progress Notes (Signed)
  Radiation Oncology         [phone] ________________________________  Name: [name] MRN: [id number]  Date: 03/11/2013  DOB: October 22, 1950  Weekly Radiation Therapy Management  Current Dose: 23.4 Gy     Planned Dose:  50.4 Gy  Narrative . . . . . . . . The patient presents for routine under treatment assessment.                                   The patient c/o bottom soreness, started having Some bright red blood with each stool past couple days, irritated stated, Eating well, no nausea, uses sitz baths 2-3x day along using baby wipes                                 Set-up films were reviewed.                                 The chart was checked. Physical Findings. . .  weight is 130 lb 4.8 oz (59.104 kg). Her oral temperature is 97.8 F (36.6 C). Her blood pressure is 120/70 and her pulse is 70. Her respiration is 16. . Weight essentially stable.  No significant changes. Impression . . . . . . . The patient is tolerating radiation. Plan . . . . . . . . . . . . Continue treatment as planned.  I do not see a weekly CBC result, and given the elevated risk for neutropenia with Mitomycin C and pelvic marrow irradiation, I ordered CBC with diff today  ________________________________  Artist Pais Kathrynn Running, M.D.      ADDENDUM:  The patient is neutropenic but not requiring break from XRT  Results for [name], [name] (MRN [id number]) as of 03/11/2013 18:42  Ref. Range 03/11/2013 16:12  NEUT# Latest Range: 1.7-7.7 K/uL 1.3 (L)    CBC    Component Value Date/Time   WBC 2.2* 03/11/2013 1612        RBC 3.42* 03/11/2013 1612        HGB 10.6* 03/11/2013 1612        HCT 30.9* 03/11/2013 1612        PLT 145 03/11/2013 1612

## 2013-03-14 ENCOUNTER — Ambulatory Visit: Payer: BC Managed Care – PPO

## 2013-03-14 ENCOUNTER — Ambulatory Visit
Admission: RE | Admit: 2013-03-14 | Discharge: 2013-03-14 | Disposition: A | Discharge status: Home / Self Care | Payer: BC Managed Care – PPO | Source: Ambulatory Visit | Attending: Radiation Oncology | Admitting: Radiation Oncology

## 2013-03-15 ENCOUNTER — Ambulatory Visit
Admission: RE | Admit: 2013-03-15 | Discharge: 2013-03-15 | Disposition: A | Discharge status: Home / Self Care | Payer: BC Managed Care – PPO | Source: Ambulatory Visit | Attending: Radiation Oncology | Admitting: Radiation Oncology

## 2013-03-15 ENCOUNTER — Ambulatory Visit: Payer: BC Managed Care – PPO

## 2013-03-16 ENCOUNTER — Ambulatory Visit
Admission: RE | Admit: 2013-03-16 | Discharge: 2013-03-16 | Disposition: A | Discharge status: Home / Self Care | Payer: BC Managed Care – PPO | Source: Ambulatory Visit | Attending: Radiation Oncology | Admitting: Radiation Oncology

## 2013-03-16 ENCOUNTER — Ambulatory Visit: Payer: BC Managed Care – PPO

## 2013-03-18 ENCOUNTER — Ambulatory Visit
Admission: RE | Admit: 2013-03-18 | Discharge: 2013-03-18 | Disposition: A | Discharge status: Home / Self Care | Payer: BC Managed Care – PPO | Source: Ambulatory Visit | Attending: Radiation Oncology | Admitting: Radiation Oncology

## 2013-03-18 ENCOUNTER — Encounter: Payer: Self-pay | Admitting: Radiation Oncology

## 2013-03-18 ENCOUNTER — Ambulatory Visit: Payer: BC Managed Care – PPO

## 2013-03-18 VITALS — BP 114/72 | HR 67 | Temp 97.8°F | Resp 20 | Wt 127.5 lb

## 2013-03-18 DIAGNOSIS — C211 Malignant neoplasm of anal canal: Secondary | ICD-10-CM

## 2013-03-18 NOTE — Progress Notes (Signed)
   Department of Radiation Oncology  Phone:  [phone] Fax:        [phone]  Weekly Treatment Note    Name: [name] Date: 03/18/2013 MRN: [id number] DOB: Aug 21, 1950   Current dose: 28.8 Gy  Current fraction: 16   MEDICATIONS: Current Outpatient Prescriptions  Medication Sig Dispense Refill  . Alum & Mag Hydroxide-Simeth (MAGIC MOUTHWASH) SOLN Take 10 mLs by mouth 4 (four) times daily as needed for mouth pain.  240 mL  1  . Cholecalciferol 10000 UNITS CAPS Take 1,000 Units by mouth daily.      . Cyanocobalamin 1500 MCG TBDP Take 1,500 mcg by mouth daily.      Marland Kitchen estrogens, conjugated, (PREMARIN) 0.625 MG tablet Take 0.625 mg by mouth daily.        . hydrochlorothiazide (MICROZIDE) 12.5 MG capsule Take 12.5 mg by mouth daily.      . naproxen sodium (ANAPROX) 220 MG tablet Take 220 mg by mouth 2 (two) times daily as needed.      . Omega-3 Fatty Acids (FISH OIL BURP-LESS PO) Take 1 tablet by mouth daily.        . prochlorperazine (COMPAZINE) 10 MG tablet Take 1 tablet (10 mg total) by mouth every 6 (six) hours as needed for nausea.  60 tablet  1  . SUMAtriptan (IMITREX) 25 MG tablet Take 25 mg by mouth every 2 (two) hours as needed. FOR MIGRAINES      . traMADol (ULTRAM) 50 MG tablet Take 1 tablet (50 mg total) by mouth every 8 (eight) hours as needed. For pain  30 tablet  0   No current facility-administered medications for this encounter.     ALLERGIES: Penicillins   LABORATORY DATA:  Lab Results  Component Value Date   WBC 2.2* 03/11/2013   HGB 10.6* 03/11/2013   HCT 30.9* 03/11/2013   MCV 90.4 03/11/2013   PLT 145 03/11/2013   Lab Results  Component Value Date   NA 137 03/02/2013   K 3.9 03/02/2013   CL 105 07/02/2011   CO2 27 03/02/2013   Lab Results  Component Value Date   ALT 12 02/21/2013   AST 22 02/21/2013   ALKPHOS 57 02/21/2013   BILITOT 0.44 02/21/2013     NARRATIVE: [name] was seen today for weekly treatment management. The chart  was checked and the patient's films were reviewed. The patient states that she is doing relatively well. Irritation in the rectal/anal region with good pain control with tramadol daily. No diarrhea. No nausea. She is using sitz baths.  PHYSICAL EXAMINATION: weight is 127 lb 8 oz (57.834 kg). Her oral temperature is 97.8 F (36.6 C). Her blood pressure is 114/72 and her pulse is 67. Her respiration is 20.        ASSESSMENT: The patient is doing satisfactorily with treatment.  PLAN: We will continue with the patient's radiation treatment as planned.

## 2013-03-18 NOTE — Progress Notes (Signed)
Pt states she has mild rectal pain, denies nausea, diarrhea, urinary/bowel issues. She has loss of appetite, fatigue. She continues to take Imodium , Compazine as needed but not daily. She takes Tramadol daily for rectal pain w/good relief.

## 2013-03-21 ENCOUNTER — Ambulatory Visit: Payer: BC Managed Care – PPO

## 2013-03-21 ENCOUNTER — Ambulatory Visit
Admission: RE | Admit: 2013-03-21 | Discharge: 2013-03-21 | Disposition: A | Discharge status: Home / Self Care | Payer: BC Managed Care – PPO | Source: Ambulatory Visit | Attending: Radiation Oncology | Admitting: Radiation Oncology

## 2013-03-22 ENCOUNTER — Ambulatory Visit: Payer: BC Managed Care – PPO

## 2013-03-22 ENCOUNTER — Ambulatory Visit
Admission: RE | Admit: 2013-03-22 | Discharge: 2013-03-22 | Disposition: A | Discharge status: Home / Self Care | Payer: BC Managed Care – PPO | Source: Ambulatory Visit | Attending: Radiation Oncology | Admitting: Radiation Oncology

## 2013-03-23 ENCOUNTER — Ambulatory Visit
Admission: RE | Admit: 2013-03-23 | Discharge: 2013-03-23 | Disposition: A | Discharge status: Home / Self Care | Payer: BC Managed Care – PPO | Source: Ambulatory Visit | Attending: Radiation Oncology | Admitting: Radiation Oncology

## 2013-03-23 ENCOUNTER — Ambulatory Visit: Payer: BC Managed Care – PPO

## 2013-03-24 ENCOUNTER — Ambulatory Visit
Admission: RE | Admit: 2013-03-24 | Discharge: 2013-03-24 | Disposition: A | Discharge status: Home / Self Care | Payer: BC Managed Care – PPO | Source: Ambulatory Visit | Attending: Radiation Oncology | Admitting: Radiation Oncology

## 2013-03-24 ENCOUNTER — Ambulatory Visit: Payer: BC Managed Care – PPO

## 2013-03-25 ENCOUNTER — Ambulatory Visit
Admission: RE | Admit: 2013-03-25 | Discharge: 2013-03-25 | Disposition: A | Discharge status: Home / Self Care | Payer: BC Managed Care – PPO | Source: Ambulatory Visit | Attending: Radiation Oncology | Admitting: Radiation Oncology

## 2013-03-25 ENCOUNTER — Encounter: Payer: Self-pay | Admitting: Radiation Oncology

## 2013-03-25 ENCOUNTER — Ambulatory Visit: Payer: BC Managed Care – PPO

## 2013-03-25 VITALS — BP 106/64 | HR 78 | Temp 97.7°F | Ht 63.0 in | Wt 127.4 lb

## 2013-03-25 DIAGNOSIS — C211 Malignant neoplasm of anal canal: Secondary | ICD-10-CM

## 2013-03-25 NOTE — Addendum Note (Signed)
Encounter addended by: Marye Round, MD on: 03/25/2013  4:57 PM<BR>     Documentation filed: Notes Section

## 2013-03-25 NOTE — Progress Notes (Signed)
  Radiation Oncology         [phone] ________________________________  Name: [name] MRN: [id number]  Date: 02/03/2013  DOB: 1950/07/30  SIMULATION AND TREATMENT PLANNING NOTE   CONSENT VERIFIED: yes   SET UP: Patient is set-up supine   IMMOBILIZATION: The following immobilization is used: alpha-cradle. This complex treatment device will be used on a daily basis during the patient's treatment.   Diagnosis: Anal cancer   NARRATIVE: The patient was brought to the [facility]. Identity was confirmed. All relevant records and images related to the planned course of therapy were reviewed. Then, the patient was positioned in a stable reproducible clinical set-up for radiation therapy using an aquaplast mask. Skin markings were placed. The CT images were loaded into the planning software where the target and avoidance structures were contoured.The radiation prescription was entered and confirmed.   The patient will receive 50.4 Gy in 28 fractions to the high-dose target region.  Daily image guidance is ordered, and this will be used on a daily basis. This is necessary to ensure accurate and precise localization of the target in addition to accurate alignment of the normal tissue structures in this region. This is particularly important given the possible motion of the high-dose target.  Treatment planning then occurred.   I have requested : Intensity Modulated Radiotherapy (IMRT) is medically necessary for this case for the following reason: Dose homogeneity; the target is in close proximity to critical normal structures, including the femoral heads, bladder, and small bowel. IMRT is thus medically to appropriately treat the patient.   Special treatment procedure  The patient will receive chemotherapy during the course of radiation treatment. The patient may experience increased or overlapping toxicity due to this combined-modality approach and the patient will be  monitored for such problems. This may include extra lab work as necessary. This therefore constitutes a special treatment procedure.     ________________________________  Jodelle Gross, MD, PhD

## 2013-03-25 NOTE — Progress Notes (Signed)
   Department of Radiation Oncology  Phone:  [phone] Fax:        [phone]  Weekly Treatment Note    Name: [name] Date: 03/25/2013 MRN: [id number] DOB: February 06, 1951   Current dose: 39.6 Gy  Current fraction: 21   MEDICATIONS: Current Outpatient Prescriptions  Medication Sig Dispense Refill  . Alum & Mag Hydroxide-Simeth (MAGIC MOUTHWASH) SOLN Take 10 mLs by mouth 4 (four) times daily as needed for mouth pain.  240 mL  1  . Cholecalciferol 10000 UNITS CAPS Take 1,000 Units by mouth daily.      . Cyanocobalamin 1500 MCG TBDP Take 1,500 mcg by mouth daily.      Marland Kitchen estrogens, conjugated, (PREMARIN) 0.625 MG tablet Take 0.625 mg by mouth daily.        . hydrochlorothiazide (MICROZIDE) 12.5 MG capsule Take 12.5 mg by mouth daily.      . naproxen sodium (ANAPROX) 220 MG tablet Take 220 mg by mouth 2 (two) times daily as needed.      . Omega-3 Fatty Acids (FISH OIL BURP-LESS PO) Take 1 tablet by mouth daily.        . prochlorperazine (COMPAZINE) 10 MG tablet Take 1 tablet (10 mg total) by mouth every 6 (six) hours as needed for nausea.  60 tablet  1  . SUMAtriptan (IMITREX) 25 MG tablet Take 25 mg by mouth every 2 (two) hours as needed. FOR MIGRAINES      . traMADol (ULTRAM) 50 MG tablet Take 1 tablet (50 mg total) by mouth every 8 (eight) hours as needed. For pain  30 tablet  0   No current facility-administered medications for this encounter.     ALLERGIES: Penicillins   LABORATORY DATA:  Lab Results  Component Value Date   WBC 2.2* 03/11/2013   HGB 10.6* 03/11/2013   HCT 30.9* 03/11/2013   MCV 90.4 03/11/2013   PLT 145 03/11/2013   Lab Results  Component Value Date   NA 137 03/02/2013   K 3.9 03/02/2013   CL 105 07/02/2011   CO2 27 03/02/2013   Lab Results  Component Value Date   ALT 12 02/21/2013   AST 22 02/21/2013   ALKPHOS 57 02/21/2013   BILITOT 0.44 02/21/2013     NARRATIVE: [name] was seen today for weekly treatment management. The chart  was checked and the patient's films were reviewed. The patient is doing well overall. She is using A and D ointment as well as using sitz baths.  She is noticing a little bit of discomfort with urination. She does not really describe this as burning. She is notice darker urine, maybe a little orange.  PHYSICAL EXAMINATION: height is 5\' 3"  (1.6 m) and weight is 127 lb 6.4 oz (57.788 kg). Her temperature is 97.7 F (36.5 C). Her blood pressure is 106/64 and her pulse is 78.        ASSESSMENT: The patient is doing satisfactorily with treatment.  PLAN: We will continue with the patient's radiation treatment as planned. The patient I believe is doing well. I would expect some dysuria at this point from the radiation treatment. I suspect that her symptoms are related to this. She is to drink plenty of fluids this weekend and we can do a urinalysis next week if her symptoms worsen greater than expected or other noticeable changes process.

## 2013-03-25 NOTE — Progress Notes (Signed)
Ms. Hughley for weekly assessment for radiation to the anal canal.  She reports a new presentation of " pinching"pain upon initiation of urination with a "rusty, orange colored urine since yesterday,.  She denies any malodorous urine and is afebrile today.  She reports it feels as if she has a urinary catheter in place.  Denies any loose stools for the past 2 days.  Mild rectal irritation rectum and obtains relief with sitz baths and A&D ointment as prescribed by Dr. Tammi Klippel.

## 2013-03-28 ENCOUNTER — Telehealth: Payer: Self-pay | Admitting: *Deleted

## 2013-03-28 ENCOUNTER — Ambulatory Visit
Admission: RE | Admit: 2013-03-28 | Discharge: 2013-03-28 | Disposition: A | Discharge status: Home / Self Care | Payer: BC Managed Care – PPO | Source: Ambulatory Visit | Attending: Radiation Oncology | Admitting: Radiation Oncology

## 2013-03-28 ENCOUNTER — Ambulatory Visit: Payer: BC Managed Care – PPO

## 2013-03-28 ENCOUNTER — Other Ambulatory Visit (HOSPITAL_BASED_OUTPATIENT_CLINIC_OR_DEPARTMENT_OTHER): Payer: BC Managed Care – PPO

## 2013-03-28 ENCOUNTER — Ambulatory Visit (HOSPITAL_BASED_OUTPATIENT_CLINIC_OR_DEPARTMENT_OTHER): Payer: BC Managed Care – PPO

## 2013-03-28 ENCOUNTER — Ambulatory Visit (HOSPITAL_BASED_OUTPATIENT_CLINIC_OR_DEPARTMENT_OTHER): Payer: BC Managed Care – PPO | Admitting: Nurse Practitioner

## 2013-03-28 ENCOUNTER — Ambulatory Visit (HOSPITAL_COMMUNITY)
Admission: RE | Admit: 2013-03-28 | Discharge: 2013-03-28 | Disposition: A | Discharge status: Home / Self Care | Payer: BC Managed Care – PPO | Source: Ambulatory Visit | Attending: Nurse Practitioner | Admitting: Nurse Practitioner

## 2013-03-28 ENCOUNTER — Other Ambulatory Visit: Payer: Self-pay | Admitting: Nurse Practitioner

## 2013-03-28 VITALS — BP 111/55 | HR 75 | Temp 97.4°F | Resp 18 | Ht 63.0 in | Wt 124.9 lb

## 2013-03-28 DIAGNOSIS — D649 Anemia, unspecified: Secondary | ICD-10-CM

## 2013-03-28 DIAGNOSIS — C211 Malignant neoplasm of anal canal: Secondary | ICD-10-CM

## 2013-03-28 DIAGNOSIS — D72819 Decreased white blood cell count, unspecified: Secondary | ICD-10-CM

## 2013-03-28 DIAGNOSIS — K1231 Oral mucositis (ulcerative) due to antineoplastic therapy: Secondary | ICD-10-CM

## 2013-03-28 DIAGNOSIS — C210 Malignant neoplasm of anus, unspecified: Secondary | ICD-10-CM | POA: Insufficient documentation

## 2013-03-28 DIAGNOSIS — Z5111 Encounter for antineoplastic chemotherapy: Secondary | ICD-10-CM

## 2013-03-28 DIAGNOSIS — D696 Thrombocytopenia, unspecified: Secondary | ICD-10-CM

## 2013-03-28 LAB — COMPREHENSIVE METABOLIC PANEL (CC13)
ALT: 15 U/L (ref 0–55)
AST: 21 U/L (ref 5–34)
Albumin: 3 g/dL — ABNORMAL LOW (ref 3.5–5.0)
Alkaline Phosphatase: 44 U/L (ref 40–150)
Anion Gap: 8 mEq/L (ref 3–11)
BUN: 9.1 mg/dL (ref 7.0–26.0)
CALCIUM: 8.6 mg/dL (ref 8.4–10.4)
CHLORIDE: 105 meq/L (ref 98–109)
CO2: 26 mEq/L (ref 22–29)
Creatinine: 0.7 mg/dL (ref 0.6–1.1)
GLUCOSE: 83 mg/dL (ref 70–140)
POTASSIUM: 3.8 meq/L (ref 3.5–5.1)
Sodium: 139 mEq/L (ref 136–145)
Total Bilirubin: 0.62 mg/dL (ref 0.20–1.20)
Total Protein: 6 g/dL — ABNORMAL LOW (ref 6.4–8.3)

## 2013-03-28 LAB — CBC WITH DIFFERENTIAL/PLATELET
BASO%: 0.4 % (ref 0.0–2.0)
BASOS ABS: 0 10*3/uL (ref 0.0–0.1)
EOS%: 6.9 % (ref 0.0–7.0)
Eosinophils Absolute: 0.2 10*3/uL (ref 0.0–0.5)
HEMATOCRIT: 31.8 % — AB (ref 34.8–46.6)
HEMOGLOBIN: 10.8 g/dL — AB (ref 11.6–15.9)
LYMPH#: 0.3 10*3/uL — AB (ref 0.9–3.3)
LYMPH%: 8.6 % — ABNORMAL LOW (ref 14.0–49.7)
MCH: 31.3 pg (ref 25.1–34.0)
MCHC: 34.1 g/dL (ref 31.5–36.0)
MCV: 91.9 fL (ref 79.5–101.0)
MONO#: 0.6 10*3/uL (ref 0.1–0.9)
MONO%: 17.4 % — ABNORMAL HIGH (ref 0.0–14.0)
NEUT%: 66.7 % (ref 38.4–76.8)
NEUTROS ABS: 2.2 10*3/uL (ref 1.5–6.5)
Platelets: 181 10*3/uL (ref 145–400)
RBC: 3.46 10*6/uL — ABNORMAL LOW (ref 3.70–5.45)
RDW: 14.5 % (ref 11.2–14.5)
WBC: 3.4 10*3/uL — ABNORMAL LOW (ref 3.9–10.3)

## 2013-03-28 MED ORDER — SODIUM CHLORIDE 0.9 % IV SOLN
Freq: Once | INTRAVENOUS | Status: AC
  Administered 2013-03-28: 17:00:00 via INTRAVENOUS

## 2013-03-28 MED ORDER — ONDANSETRON 8 MG/50ML IVPB (CHCC)
8.0000 mg | Freq: Once | INTRAVENOUS | Status: AC
  Administered 2013-03-28: 8 mg via INTRAVENOUS

## 2013-03-28 MED ORDER — SODIUM CHLORIDE 0.9 % IV SOLN
1000.0000 mg/m2/d | INTRAVENOUS | Status: DC
  Administered 2013-03-28: 6500 mg via INTRAVENOUS
  Filled 2013-03-28: qty 130

## 2013-03-28 MED ORDER — DEXAMETHASONE SODIUM PHOSPHATE 10 MG/ML IJ SOLN
INTRAMUSCULAR | Status: AC
  Filled 2013-03-28: qty 1

## 2013-03-28 MED ORDER — HEPARIN SOD (PORK) LOCK FLUSH 100 UNIT/ML IV SOLN
250.0000 [IU] | Freq: Once | INTRAVENOUS | Status: DC | PRN
  Filled 2013-03-28: qty 5

## 2013-03-28 MED ORDER — MITOMYCIN CHEMO IV INJECTION 20 MG
8.0000 mg/m2 | Freq: Once | INTRAVENOUS | Status: AC
  Administered 2013-03-28: 13 mg via INTRAVENOUS
  Filled 2013-03-28: qty 26

## 2013-03-28 MED ORDER — TRAMADOL HCL 50 MG PO TABS: 50.0000 mg | ORAL_TABLET | Freq: Three times a day (TID) | ORAL | Status: DC | PRN

## 2013-03-28 MED ORDER — DEXAMETHASONE SODIUM PHOSPHATE 10 MG/ML IJ SOLN
10.0000 mg | Freq: Once | INTRAMUSCULAR | Status: AC
  Administered 2013-03-28: 10 mg via INTRAVENOUS

## 2013-03-28 MED ORDER — SODIUM CHLORIDE 0.9 % IJ SOLN
10.0000 mL | INTRAMUSCULAR | Status: DC | PRN
  Filled 2013-03-28: qty 10

## 2013-03-28 NOTE — Patient Instructions (Signed)
[facility] Discharge Instructions for Patients Receiving Chemotherapy  Today you received the following chemotherapy agents :Mitomycin, 5FU (you are going home with a pump for 96hrs)  To help prevent nausea and vomiting after your treatment, we encourage you to take your nausea medication as prescribed.   If you develop nausea and vomiting that is not controlled by your nausea medication, call the clinic.   BELOW ARE SYMPTOMS THAT SHOULD BE REPORTED IMMEDIATELY:  *FEVER GREATER THAN 100.5 F  *CHILLS WITH OR WITHOUT FEVER  NAUSEA AND VOMITING THAT IS NOT CONTROLLED WITH YOUR NAUSEA MEDICATION  *UNUSUAL SHORTNESS OF BREATH  *UNUSUAL BRUISING OR BLEEDING  TENDERNESS IN MOUTH AND THROAT WITH OR WITHOUT PRESENCE OF ULCERS  *URINARY PROBLEMS  *BOWEL PROBLEMS  UNUSUAL RASH Items with * indicate a potential emergency and should be followed up as soon as possible.  Feel free to call the clinic you have any questions or concerns. The clinic phone number is [phone].

## 2013-03-28 NOTE — Telephone Encounter (Signed)
[name] with Interventional radiology called asking if this patient may come in now (11:20 AM ) OR 12:30 at the latest.  [name] NP scheduled f/u time is 11:45 am with this patient.  [name] will have the patient go after f/u at 12:30 pm.

## 2013-03-28 NOTE — Progress Notes (Signed)
OFFICE PROGRESS NOTE  Interval history:  [name] returns for followup of anal cancer. The mouth sores have healed. She is eating and drinking without difficulty. She had an episode of mild nausea yesterday. None today. She has occasional loose stools. She notes frequent bowel movements, 3-4 times a day. She occasionally notes a small amount of blood on the toilet tissue. No skin rash except related to radiation in the groin regions. She takes one tramadol daily on average for rectal pain.   Objective: Filed Vitals:   03/28/13 1127  BP: 111/55  Pulse: 75  Temp: 97.4 F (36.3 C)  Resp: 18   Oropharynx is without thrush or ulceration. Mucous membranes are moist. Lungs are clear. Regular cardiac rhythm. Abdomen soft and nontender. No hepatomegaly. Mild erythema at the medial groin regions/upper thighs. No skin breakdown. Mild perianal and perineal erythema and superficial ulceration.   Lab Results: Lab Results  Component Value Date   WBC 3.4* 03/28/2013   HGB 10.8* 03/28/2013   HCT 31.8* 03/28/2013   MCV 91.9 03/28/2013   PLT 181 03/28/2013   NEUTROABS 2.2 03/28/2013    Chemistry:    Chemistry      Component Value Date/Time   NA 139 03/28/2013 1109   NA 140 07/02/2011 0430   K 3.8 03/28/2013 1109   K 3.7 07/02/2011 0430   CL 105 07/02/2011 0430   CO2 26 03/28/2013 1109   CO2 28 07/02/2011 0430   BUN 9.1 03/28/2013 1109   BUN 14 07/02/2011 0430   CREATININE 0.7 03/28/2013 1109   CREATININE 0.68 07/02/2011 0430      Component Value Date/Time   CALCIUM 8.6 03/28/2013 1109   CALCIUM 8.6 07/02/2011 0430   ALKPHOS 44 03/28/2013 1109   AST 21 03/28/2013 1109   ALT 15 03/28/2013 1109   BILITOT 0.62 03/28/2013 1109       Studies/Results: No results found.  Medications: I have reviewed the patient's current medications.  Assessment/Plan: 1. 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  Staging CT of the abdomen/pelvis 01/24/2013 revealed small  perirectal lymph nodes and a 6 mm left internal iliac node.  Radiation and cycle 1 5-FU/mitomycin C. initiated on 02/21/2013. 2. Pericarditis 2006. 3. History of thrombocytopenia. Question ITP. 4. Status post TAH/BSO. 5. Family history multiple cancers. 6. Mucositis secondary to chemotherapy. 7. Mild leukopenia and thrombocytopenia following cycle 1 5-FU/mitomycin-C. Improved. 8. Anemia. Stable.   Dispositon-she appears stable. Plan to proceed with cycle 2 5-FU/mitomycin-C today as scheduled. She is scheduled to have a PICC line placed later today. We will arrange to have the PICC line removed at the time of pump discontinuation on 04/01/2013. She is scheduled to return for a followup visit with Dr. Benay Spice on 04/07/2013. She will contact the office in the interim with any problems. We specifically discussed mouth sores.  Plan reviewed with Dr. Benay Spice.   [name] ANP/GNP-BC

## 2013-03-28 NOTE — Procedures (Signed)
US/fluoroscopic guided right SL basilic vein PICC placed. Length 38 cm. Tip SVC/RA junction. No immediate complications. Ok to use.

## 2013-03-29 ENCOUNTER — Ambulatory Visit: Payer: BC Managed Care – PPO

## 2013-03-29 ENCOUNTER — Ambulatory Visit
Admission: RE | Admit: 2013-03-29 | Discharge: 2013-03-29 | Disposition: A | Discharge status: Home / Self Care | Payer: BC Managed Care – PPO | Source: Ambulatory Visit | Attending: Radiation Oncology | Admitting: Radiation Oncology

## 2013-03-29 NOTE — Progress Notes (Signed)
Picc dressing changed. Single lumen running so line was not flushed

## 2013-03-30 ENCOUNTER — Ambulatory Visit
Admission: RE | Admit: 2013-03-30 | Discharge: 2013-03-30 | Disposition: A | Discharge status: Home / Self Care | Payer: BC Managed Care – PPO | Source: Ambulatory Visit | Attending: Radiation Oncology | Admitting: Radiation Oncology

## 2013-03-30 ENCOUNTER — Ambulatory Visit: Payer: BC Managed Care – PPO

## 2013-03-31 ENCOUNTER — Ambulatory Visit
Admission: RE | Admit: 2013-03-31 | Discharge: 2013-03-31 | Disposition: A | Discharge status: Home / Self Care | Payer: BC Managed Care – PPO | Source: Ambulatory Visit | Attending: Radiation Oncology | Admitting: Radiation Oncology

## 2013-03-31 ENCOUNTER — Ambulatory Visit: Payer: BC Managed Care – PPO

## 2013-04-01 ENCOUNTER — Ambulatory Visit
Admission: RE | Admit: 2013-04-01 | Discharge: 2013-04-01 | Disposition: A | Discharge status: Home / Self Care | Payer: BC Managed Care – PPO | Source: Ambulatory Visit | Attending: Radiation Oncology | Admitting: Radiation Oncology

## 2013-04-01 ENCOUNTER — Ambulatory Visit (HOSPITAL_BASED_OUTPATIENT_CLINIC_OR_DEPARTMENT_OTHER): Payer: BC Managed Care – PPO

## 2013-04-01 ENCOUNTER — Ambulatory Visit: Payer: BC Managed Care – PPO

## 2013-04-01 ENCOUNTER — Encounter: Payer: Self-pay | Admitting: Radiation Oncology

## 2013-04-01 VITALS — BP 107/58 | HR 82 | Temp 97.9°F | Resp 18

## 2013-04-01 VITALS — Wt 125.2 lb

## 2013-04-01 DIAGNOSIS — C211 Malignant neoplasm of anal canal: Secondary | ICD-10-CM

## 2013-04-01 MED ORDER — LIDOCAINE HCL 2 % EX GEL: 1.0000 "application " | CUTANEOUS | Status: DC | PRN

## 2013-04-01 NOTE — Patient Instructions (Signed)
Peripherally Inserted Central Catheter (PICC) Removal and Care After A peripherally inserted catheter (PICC) is removed when it is no longer needed, when it is clotted, or when it may be infected.  PROCEDURE  The removal of a PICC is usually painless. Removing the tape that holds the PICC in place may be the most discomfort you have.  A physicians order needs to be obtained to have the PICC removed.  A PICC can be removed in the hospital or in an outpatient setting.  Never remove or take out the PICC yourself. Only a trained clinical professional, such as a PICC nurse, should remove the PICC.  If a PICC is suspected to be infected, the PICC tip is sent to the lab for culture. HOME CARE INSTRUCTIONS  When the PICC is out, pressure is applied at the insertion site to prevent bleeding. An antibiotic ointment may be applied to the insertion site. A dry, sterile gauze is then taped over the insertion site. This dressing should stay on for 24 hours.  After the 24 hours is up, the dressing may be removed. The PICC insertion site is very small. A small scab may develop over the insertion site. It is okay to wash the site gently with soap and water. Be careful to not remove or pick the scab off. After washing, gently pat the site dry. You do not need to put another dressing over the insertion site after you wash it.  Avoid heavy, strenuous physical activity for 24 hours after the PICC is removed. This includes things like:  Weight lifting.  Strenuous yard work.  Any physical activity with repetitive arm movement. SEEK MEDICAL CARE IF:  Call or see your caregiver as soon as possible if you develop the following conditions in the arm in which the PICC was inserted:  Swelling or puffiness.  Increasing tenderness or pain. SEEK IMMEDIATE MEDICAL CARE IF:  You develop any of the following conditions in the arm that had the PICC:  Numbness or tingling in your fingers, hand, or arm.  You arm has  a bluish color and it is cold to the touch.  Redness around the insertion site or a red-streak that goes up your arm.  Any type of drainage from the PICC insertion site. This includes drainage such as:  Bleeding from the insertion site. (If this happens, apply firm, direct pressure to the PICC insertion site with a clean towel.)  Drainage that is yellow or tan in color.  You have an oral temperature above 102 F (38.9 C), not controlled by medicine. Document Released: 08/21/2009 Document Revised: 05/26/2011 Document Reviewed: 08/21/2009 [employer] Patient Information 2014 [employer].  

## 2013-04-01 NOTE — Progress Notes (Signed)
   Department of Radiation Oncology  Phone:  [phone] Fax:        [phone]  Weekly Treatment Note    Name: [name] Date: 04/01/2013 MRN: [id number] DOB: May 02, 1950   Current dose: 48.6 Gy  Current fraction: 27   MEDICATIONS: Current Outpatient Prescriptions  Medication Sig Dispense Refill  . Cholecalciferol 10000 UNITS CAPS Take 1,000 Units by mouth daily.      . Cyanocobalamin 1500 MCG TBDP Take 1,500 mcg by mouth daily.      Marland Kitchen estrogens, conjugated, (PREMARIN) 0.625 MG tablet Take 0.625 mg by mouth daily.        . hydrochlorothiazide (MICROZIDE) 12.5 MG capsule Take 12.5 mg by mouth daily.      . naproxen sodium (ANAPROX) 220 MG tablet Take 220 mg by mouth 2 (two) times daily as needed.      . Omega-3 Fatty Acids (FISH OIL BURP-LESS PO) Take 1 tablet by mouth daily.        . prochlorperazine (COMPAZINE) 10 MG tablet Take 1 tablet (10 mg total) by mouth every 6 (six) hours as needed for nausea.  60 tablet  1  . SUMAtriptan (IMITREX) 25 MG tablet Take 25 mg by mouth every 2 (two) hours as needed. FOR MIGRAINES      . traMADol (ULTRAM) 50 MG tablet Take 1 tablet (50 mg total) by mouth every 8 (eight) hours as needed. For pain  50 tablet  0  . Alum & Mag Hydroxide-Simeth (MAGIC MOUTHWASH) SOLN Take 10 mLs by mouth 4 (four) times daily as needed for mouth pain.  240 mL  1  . lidocaine (XYLOCAINE JELLY) 2 % jelly Apply 1 application topically as needed.  30 mL  1   No current facility-administered medications for this encounter.     ALLERGIES: Penicillins   LABORATORY DATA:  Lab Results  Component Value Date   WBC 3.4* 03/28/2013   HGB 10.8* 03/28/2013   HCT 31.8* 03/28/2013   MCV 91.9 03/28/2013   PLT 181 03/28/2013   Lab Results  Component Value Date   NA 139 03/28/2013   K 3.8 03/28/2013   CL 105 07/02/2011   CO2 26 03/28/2013   Lab Results  Component Value Date   ALT 15 03/28/2013   AST 21 03/28/2013   ALKPHOS 44 03/28/2013   BILITOT 0.62 03/28/2013      NARRATIVE: [name] was seen today for weekly treatment management. The chart was checked and the patient's films were reviewed. The patient complains of some increased skin irritation. She is using sitz baths and A&D ointment.Marland Kitchen  PHYSICAL EXAMINATION: weight is 125 lb 3.2 oz (56.79 kg).      irritation in the inguinal regions without moist desquamation. Some greater irritation in the anal region with some moistness present.  ASSESSMENT: The patient is doing satisfactorily with treatment.  PLAN: We will continue with the patient's radiation treatment as planned. Overall her skin has done satisfactorily. I did encourage her to finish her final fraction on Monday. I have also given her a prescription for Xylocaine.

## 2013-04-01 NOTE — Progress Notes (Signed)
1525- right arm PICC removed without difficulty, measuring at 38 cm. Tip intact. Pressure held for 5 mins. Explained after care for PICC removal to patient, importance of keeping dressing and no shower for 24 hrs. Voices understanding. Patient under 30 min observation.   1555- dressing clean dry and intact upon discharging. Patient and VS stable

## 2013-04-01 NOTE — Progress Notes (Signed)
Weekly rad txs      Anal 27/28completed   today, 1 month f/u appt card given  To ptaien, picc removed and vitals taken in med onc, deferred to be taken again, patient wants today to be last treatment, sitz baths and a& d ointment on bottom, no skin broken just irritated states patient,  1 stool daily now states patient, dysuria with urina, no blood, drinking more water, fatyiged Urine yellow 4:47 PM

## 2013-04-04 ENCOUNTER — Telehealth: Payer: Self-pay | Admitting: Oncology

## 2013-04-04 ENCOUNTER — Ambulatory Visit
Admission: RE | Admit: 2013-04-04 | Discharge: 2013-04-04 | Disposition: A | Discharge status: Home / Self Care | Payer: BC Managed Care – PPO | Source: Ambulatory Visit | Attending: Radiation Oncology | Admitting: Radiation Oncology

## 2013-04-04 ENCOUNTER — Encounter: Payer: Self-pay | Admitting: Radiation Oncology

## 2013-04-04 DIAGNOSIS — C211 Malignant neoplasm of anal canal: Secondary | ICD-10-CM

## 2013-04-04 LAB — URINALYSIS, MICROSCOPIC - CHCC
BILIRUBIN (URINE): NEGATIVE
GLUCOSE UR CHCC: NEGATIVE mg/dL
Ketones: 5 mg/dL
Nitrite: NEGATIVE
PH: 6 (ref 4.6–8.0)
Protein: 100 mg/dL
Specific Gravity, Urine: 1.03 (ref 1.003–1.035)
Urobilinogen, UR: 0.2 mg/dL (ref 0.2–1)

## 2013-04-04 MED ORDER — NITROFURANTOIN MONOHYD MACRO 100 MG PO CAPS: 100.0000 mg | ORAL_CAPSULE | Freq: Two times a day (BID) | ORAL | Status: DC

## 2013-04-04 NOTE — Progress Notes (Signed)
The patient is finishing her final fraction today. She has not felt well over the weekend, decreased appetite with some diarrhea. She began taking Imodium this morning. Complaining of increased burning with urination.  Vital signs did not show orthostatics. Patient afebrile.  The patient will complete her final fraction today. We will check a urinalysis. She is interested in pyridium if a urinary tract infection is not confirmed for symptomatic relief. She will continue Imodium and let us know if this is not adequate.

## 2013-04-04 NOTE — Progress Notes (Addendum)
Patient in lobby c/o dizzyness,weakness, went and got w/c,escorted patient to room 6, took orthostatic vitals after patient stating hasn't eaten all weekend, being very weak,just no appetite when you feel terrrible",having diarrhea started last night at 5 am,took imodium at 8am, and took imodium 2 more times, today,it has started to settle down", not orthostatic sitting and standing b/p's, patient has 1 more treatment, offered gingerale, patient sipping water coming in,this will be her last treatment if able, increased dysuria,"burning every time I void"   3:51 PM   patient sent to lab for urine test vbefore treatment, Faith RT called and informed

## 2013-04-04 NOTE — Telephone Encounter (Signed)
Called [name] and let her know that Dr. Lisbeth Renshaw has sent in a prescription for an antibiotic to [facility] for her positive urinalysis.

## 2013-04-07 ENCOUNTER — Ambulatory Visit (HOSPITAL_BASED_OUTPATIENT_CLINIC_OR_DEPARTMENT_OTHER): Payer: BC Managed Care – PPO

## 2013-04-07 ENCOUNTER — Ambulatory Visit (HOSPITAL_BASED_OUTPATIENT_CLINIC_OR_DEPARTMENT_OTHER): Payer: BC Managed Care – PPO | Admitting: Oncology

## 2013-04-07 ENCOUNTER — Encounter (HOSPITAL_COMMUNITY): Payer: Self-pay | Admitting: Emergency Medicine

## 2013-04-07 ENCOUNTER — Telehealth: Payer: Self-pay | Admitting: Oncology

## 2013-04-07 ENCOUNTER — Emergency Department (HOSPITAL_COMMUNITY)
Admission: EM | Admit: 2013-04-07 | Discharge: 2013-04-07 | Disposition: A | Discharge status: Home / Self Care | Payer: BC Managed Care – PPO | Attending: Emergency Medicine | Admitting: Emergency Medicine

## 2013-04-07 ENCOUNTER — Other Ambulatory Visit (HOSPITAL_BASED_OUTPATIENT_CLINIC_OR_DEPARTMENT_OTHER): Payer: BC Managed Care – PPO

## 2013-04-07 VITALS — BP 110/67 | HR 119 | Temp 98.1°F | Resp 18 | Ht 63.0 in | Wt 121.2 lb

## 2013-04-07 DIAGNOSIS — Z8739 Personal history of other diseases of the musculoskeletal system and connective tissue: Secondary | ICD-10-CM | POA: Insufficient documentation

## 2013-04-07 DIAGNOSIS — Z8679 Personal history of other diseases of the circulatory system: Secondary | ICD-10-CM | POA: Insufficient documentation

## 2013-04-07 DIAGNOSIS — T7840XA Allergy, unspecified, initial encounter: Secondary | ICD-10-CM

## 2013-04-07 DIAGNOSIS — T368X5A Adverse effect of other systemic antibiotics, initial encounter: Secondary | ICD-10-CM | POA: Insufficient documentation

## 2013-04-07 DIAGNOSIS — C211 Malignant neoplasm of anal canal: Secondary | ICD-10-CM

## 2013-04-07 DIAGNOSIS — Z8709 Personal history of other diseases of the respiratory system: Secondary | ICD-10-CM | POA: Insufficient documentation

## 2013-04-07 DIAGNOSIS — C20 Malignant neoplasm of rectum: Secondary | ICD-10-CM

## 2013-04-07 DIAGNOSIS — R220 Localized swelling, mass and lump, head: Secondary | ICD-10-CM | POA: Insufficient documentation

## 2013-04-07 DIAGNOSIS — R21 Rash and other nonspecific skin eruption: Secondary | ICD-10-CM

## 2013-04-07 DIAGNOSIS — R221 Localized swelling, mass and lump, neck: Principal | ICD-10-CM

## 2013-04-07 DIAGNOSIS — D61818 Other pancytopenia: Secondary | ICD-10-CM

## 2013-04-07 DIAGNOSIS — Z79899 Other long term (current) drug therapy: Secondary | ICD-10-CM | POA: Insufficient documentation

## 2013-04-07 DIAGNOSIS — R300 Dysuria: Secondary | ICD-10-CM

## 2013-04-07 DIAGNOSIS — Z880 Allergy status to penicillin: Secondary | ICD-10-CM | POA: Insufficient documentation

## 2013-04-07 DIAGNOSIS — Z8742 Personal history of other diseases of the female genital tract: Secondary | ICD-10-CM | POA: Insufficient documentation

## 2013-04-07 LAB — CBC WITH DIFFERENTIAL/PLATELET
BASO%: 0 % (ref 0.0–2.0)
BASOS ABS: 0 10*3/uL (ref 0.0–0.1)
EOS%: 6.8 % (ref 0.0–7.0)
Eosinophils Absolute: 0 10*3/uL (ref 0.0–0.5)
HCT: 26.4 % — ABNORMAL LOW (ref 34.8–46.6)
HEMOGLOBIN: 9.2 g/dL — AB (ref 11.6–15.9)
LYMPH%: 8.5 % — ABNORMAL LOW (ref 14.0–49.7)
MCH: 30.7 pg (ref 25.1–34.0)
MCHC: 34.8 g/dL (ref 31.5–36.0)
MCV: 88 fL (ref 79.5–101.0)
MONO#: 0.4 10*3/uL (ref 0.1–0.9)
MONO%: 61 % — AB (ref 0.0–14.0)
NEUT#: 0.1 10*3/uL — CL (ref 1.5–6.5)
NEUT%: 23.7 % — ABNORMAL LOW (ref 38.4–76.8)
Platelets: 122 10*3/uL — ABNORMAL LOW (ref 145–400)
RBC: 3 10*6/uL — ABNORMAL LOW (ref 3.70–5.45)
RDW: 14.1 % (ref 11.2–14.5)
WBC: 0.6 10*3/uL — CL (ref 3.9–10.3)
lymph#: 0.1 10*3/uL — ABNORMAL LOW (ref 0.9–3.3)

## 2013-04-07 MED ORDER — CIPROFLOXACIN HCL 500 MG PO TABS: 500.0000 mg | ORAL_TABLET | Freq: Two times a day (BID) | ORAL | Status: DC

## 2013-04-07 MED ORDER — HYDROCODONE-ACETAMINOPHEN 5-325 MG PO TABS: 1.0000 | ORAL_TABLET | Freq: Four times a day (QID) | ORAL | Status: DC | PRN

## 2013-04-07 MED ORDER — SODIUM CHLORIDE 0.9 % IV SOLN
INTRAVENOUS | Status: AC
  Administered 2013-04-07: 11:00:00 via INTRAVENOUS

## 2013-04-07 NOTE — ED Notes (Signed)
Pt states the medication given earlier has helped. Lips are swollen and and eyelids puffy.

## 2013-04-07 NOTE — Telephone Encounter (Signed)
s/w pt re appts for lb/inf 1/23 @ 10am and LT 1/28. time for 1/28 per pt due to drive. pt was to return to lab today but bypassed scheduling. lab scheduled for tomorrow w/IVF's. desk nurse aware

## 2013-04-07 NOTE — Discharge Instructions (Signed)
Take the first dose of prednisone tonight along with Benadryl 25-50 mg.   Followup with your oncologist tomorrow.   No new antibiotic for now

## 2013-04-07 NOTE — ED Notes (Signed)
Took Cipro for first time today for UTI and states throat felt like it was swelling. Pt took Benadryl 75mg  and EMS administered Solumedrol 125mg  (@1709 ) IV en route. Symptoms began at 1600. Pt states throat still feels a little swollen but feels better than it did initially.

## 2013-04-07 NOTE — Patient Instructions (Addendum)
Dehydration, Adult Dehydration is when you lose more fluids from the body than you take in. Vital organs like the kidneys, brain, and heart cannot function without a proper amount of fluids and salt. Any loss of fluids from the body can cause dehydration.  CAUSES   Vomiting.  Diarrhea.  Excessive sweating.  Excessive urine output.  Fever. SYMPTOMS  Mild dehydration  Thirst.  Dry lips.  Slightly dry mouth. Moderate dehydration  Very dry mouth.  Sunken eyes.  Skin does not bounce back quickly when lightly pinched and released.  Dark urine and decreased urine production.  Decreased tear production.  Headache. Severe dehydration  Very dry mouth.  Extreme thirst.  Rapid, weak pulse (more than 100 beats per minute at rest).  Cold hands and feet.  Not able to sweat in spite of heat and temperature.  Rapid breathing.  Blue lips.  Confusion and lethargy.  Difficulty being awakened.  Minimal urine production.  No tears. DIAGNOSIS  Your caregiver will diagnose dehydration based on your symptoms and your exam. Blood and urine tests will help confirm the diagnosis. The diagnostic evaluation should also identify the cause of dehydration. TREATMENT  Treatment of mild or moderate dehydration can often be done at home by increasing the amount of fluids that you drink. It is best to drink small amounts of fluid more often. Drinking too much at one time can make vomiting worse. Refer to the home care instructions below. Severe dehydration needs to be treated at the hospital where you will probably be given intravenous (IV) fluids that contain water and electrolytes. HOME CARE INSTRUCTIONS   Ask your caregiver about specific rehydration instructions.  Drink enough fluids to keep your urine clear or pale yellow.  Drink small amounts frequently if you have nausea and vomiting.  Eat as you normally do.  Avoid:  Foods or drinks high in sugar.  Carbonated  drinks.  Juice.  Extremely hot or cold fluids.  Drinks with caffeine.  Fatty, greasy foods.  Alcohol.  Tobacco.  Overeating.  Gelatin desserts.  Wash your hands well to avoid spreading bacteria and viruses.  Only take over-the-counter or prescription medicines for pain, discomfort, or fever as directed by your caregiver.  Ask your caregiver if you should continue all prescribed and over-the-counter medicines.  Keep all follow-up appointments with your caregiver. SEEK MEDICAL CARE IF:  You have abdominal pain and it increases or stays in one area (localizes).  You have a rash, stiff neck, or severe headache.  You are irritable, sleepy, or difficult to awaken.  You are weak, dizzy, or extremely thirsty. SEEK IMMEDIATE MEDICAL CARE IF:   You are unable to keep fluids down or you get worse despite treatment.  You have frequent episodes of vomiting or diarrhea.  You have blood or green matter (bile) in your vomit.  You have blood in your stool or your stool looks black and tarry.  You have not urinated in 6 to 8 hours, or you have only urinated a small amount of very dark urine.  You have a fever.  You faint. MAKE SURE YOU:   Understand these instructions.  Will watch your condition.  Will get help right away if you are not doing well or get worse. Document Released: 03/03/2005 Document Revised: 05/26/2011 Document Reviewed: 10/21/2010 [facility] Patient Information 2014 [place], [place].  It was my pleasure to take care of you today!  Leeanne Rio, RN  Neutropenia Neutropenia is a condition that occurs when the level of a  certain type of Whittenberg blood cell (neutrophil) in your body becomes lower than normal. Neutrophils are made in the bone marrow and fight infections. These cells protect against bacteria and viruses. The fewer neutrophils you have, and the longer your body remains without them, the greater your risk of getting a severe infection becomes. CAUSES   The cause of neutropenia may be hard to determine. However, it is usually due to 3 main problems:   Decreased production of neutrophils. This may be due to:  Certain medicines such as chemotherapy.  Genetic problems.  Cancer.  Radiation treatments.  Vitamin deficiency.  Some pesticides.  Increased destruction of neutrophils. This may be due to:  Overwhelming infections.  Hemolytic anemia. This is when the body destroys its own blood cells.  Chemotherapy.  Neutrophils moving to areas of the body where they cannot fight infections. This may be due to:  Dialysis procedures.  Conditions where the spleen becomes enlarged. Neutrophils are held in the spleen and are not available to the rest of the body.  Overwhelming infections. The neutrophils are held in the area of the infection and are not available to the rest of the body. SYMPTOMS  There are no specific symptoms of neutropenia. The lack of neutrophils can result in an infection, and an infection can cause various problems. DIAGNOSIS  Diagnosis is made by a blood test. A complete blood count is performed. The normal level of neutrophils in human blood differs with age and race. Infants have lower counts than older children and adults. African Americans have lower counts than Caucasians or Asians. The average adult level is 1500 cells/mm3 of blood. Neutrophil counts are interpreted as follows:  Greater than 1000 cells/mm3 gives normal protection against infection.  500 to 1000 cells/mm3 gives an increased risk for infection.  200 to 500 cells/mm3 is a greater risk for severe infection.  Lower than 200 cells/mm3 is a marked risk of infection. This may require hospitalization and treatment with antibiotic medicines. TREATMENT  Treatment depends on the underlying cause, severity, and presence of infections or symptoms. It also depends on your health. Your caregiver will discuss the treatment plan with you. Mild cases are  often easily treated and have a good outcome. Preventative measures may also be started to limit your risk of infections. Treatment can include:  Taking antibiotics.  Stopping medicines that are known to cause neutropenia.  Correcting nutritional deficiencies by eating green vegetables to supply folic acid and taking vitamin B supplements.  Stopping exposure to pesticides if your neutropenia is related to pesticide exposure.  Taking a blood growth factor called sargramostim, pegfilgrastim, or filgrastim if you are undergoing chemotherapy for cancer. This stimulates Steinke blood cell production.  Removal of the spleen if you have Felty's syndrome and have repeated infections. HOME CARE INSTRUCTIONS   Follow your caregiver's instructions about when you need to have blood work done.  Wash your hands often. Make sure others who come in contact with you also wash their hands.  Wash raw fruits and vegetables before eating them. They can carry bacteria and fungi.  Avoid people with colds or spreadable (contagious) diseases (chickenpox, herpes zoster, influenza).  Avoid large crowds.  Avoid construction areas. The dust can release fungus into the air.  Be cautious around children in daycare or school environments.  Take care of your respiratory system by coughing and deep breathing.  Bathe daily.  Protect your skin from cuts and burns.  Do not work in the garden or with flowers  and plants.  Care for the mouth before and after meals by brushing with a soft toothbrush. If you have mucositis, do not use mouthwash. Mouthwash contains alcohol and can dry out the mouth even more.  Clean the area between the genitals and the anus (perineal area) after urination and bowel movements. Women need to wipe from front to back.  Use a water soluble lubricant during sexual intercourse and practice good hygiene after. Do not have intercourse if you are severely neutropenic. Check with your caregiver  for guidelines.  Exercise daily as tolerated.  Avoid people who were vaccinated with a live vaccine in the past 30 days. You should not receive live vaccines (polio, typhoid).  Do not provide direct care for pets. Avoid animal droppings. Do not clean litter boxes and bird cages.  Do not share food utensils.  Do not use tampons, enemas, or rectal suppositories unless directed by your caregiver.  Use an electric razor to remove hair.  Wash your hands after handling magazines, letters, and newspapers. SEEK IMMEDIATE MEDICAL CARE IF:   You have a fever.  You have chills or start to shake.  You feel nauseous or vomit.  You develop mouth sores.  You develop aches and pains.  You have redness and swelling around open wounds.  Your skin is warm to the touch.  You have pus coming from your wounds.  You develop swollen lymph nodes.  You feel weak or fatigued.  You develop red streaks on the skin. MAKE SURE YOU:  Understand these instructions.  Will watch your condition.  Will get help right away if you are not doing well or get worse. Document Released: 08/23/2001 Document Revised: 05/26/2011 Document Reviewed: 09/20/2010 [facility] Patient Information 2014 [place], [place].

## 2013-04-07 NOTE — ED Provider Notes (Signed)
CSN: [id number]     Arrival date & time 04/07/13  1736 History   First MD Initiated Contact with Patient 04/07/13 1747     Chief Complaint  Patient presents with  . Allergic Reaction   (Consider location/radiation/quality/duration/timing/severity/associated sxs/prior Treatment) HPI.... patient took first dose of Cipro this afternoon followed by a sensation of facial and throat swelling. Medication prescribed by oncologist for a urinary tract infection.  No airway obstruction. Able to swallow. Patient is being treated for rectal carcinoma with chemotherapy and radiation. EMS transported patient to hospital.  She took Benadryl 75 mg at home and received Solu Medrol 125 mg IV via EMS.   She feels better now.  Past Medical History  Diagnosis Date  . Perimenopausal   . Myocarditis, unspecified   . Unspecified disease of pericardium   . Pericardial effusion   . Pericarditis   . Headache(784.0)   . Plantar fasciitis of right foot   . Allergic sinusitis   . Pericarditis     history 2006 from virus  . Cancer 01/21/13    rectum   Past Surgical History  Procedure Laterality Date  . Pericardial window on november 11,2006, by dr bartle    . Other surgical history      post hysterectomy 1991  . Abdominal hysterectomy    . Colonoscopy    . Orif clavicular fracture  07/15/2011    Procedure: OPEN REDUCTION INTERNAL FIXATION (ORIF) CLAVICULAR FRACTURE;  Surgeon: Nita Sells, MD;  Location: [place];  Service: Orthopedics;  Laterality: Right;   Family History  Problem Relation Age of Onset  . Coronary artery disease Sister     stented at age 67  . Cancer Sister 25    melanoma  . Cancer Father     bladder  . Cancer Maternal Aunt 68    pancreas  . Cancer Paternal Aunt 45    breast cancer   History  Substance Use Topics  . Smoking status: Never Smoker   . Smokeless tobacco: Not on file  . Alcohol Use: Yes     Comment: occasional   OB History   Grav Para  Term Preterm Abortions TAB SAB Ect Mult Living                 Review of Systems  All other systems reviewed and are negative.    Allergies  Ciprofloxacin and Penicillins  Home Medications   Current Outpatient Rx  Name  Route  Sig  Dispense  Refill  . Alum & Mag Hydroxide-Simeth (MAGIC MOUTHWASH) SOLN   Oral   Take 10 mLs by mouth 4 (four) times daily as needed for mouth pain.   240 mL   1   . Cholecalciferol 10000 UNITS CAPS   Oral   Take 1,000 Units by mouth daily.         . Cyanocobalamin 1500 MCG TBDP   Oral   Take 1,500 mcg by mouth daily.         Marland Kitchen estrogens, conjugated, (PREMARIN) 0.625 MG tablet   Oral   Take 0.625 mg by mouth daily.           . hydrochlorothiazide (MICROZIDE) 12.5 MG capsule   Oral   Take 12.5 mg by mouth daily.         Marland Kitchen HYDROcodone-acetaminophen (NORCO/VICODIN) 5-325 MG per tablet   Oral   Take 1 tablet by mouth every 6 (six) hours as needed for moderate pain.   50 tablet  0   . lidocaine (XYLOCAINE JELLY) 2 % jelly   Topical   Apply 1 application topically as needed.   30 mL   1   . loperamide (IMODIUM) 2 MG capsule   Oral   Take 2 mg by mouth as needed for diarrhea or loose stools.         . naproxen sodium (ANAPROX) 220 MG tablet   Oral   Take 220 mg by mouth 2 (two) times daily as needed (pain).          . Omega-3 Fatty Acids (FISH OIL BURP-LESS PO)   Oral   Take 1 tablet by mouth daily.           . prochlorperazine (COMPAZINE) 10 MG tablet   Oral   Take 1 tablet (10 mg total) by mouth every 6 (six) hours as needed for nausea.   60 tablet   1   . SUMAtriptan (IMITREX) 25 MG tablet   Oral   Take 25 mg by mouth every 2 (two) hours as needed. FOR MIGRAINES         . traMADol (ULTRAM) 50 MG tablet   Oral   Take 1 tablet (50 mg total) by mouth every 8 (eight) hours as needed. For pain   50 tablet   0    BP 100/57  Pulse 99  Temp(Src) 99.5 F (37.5 C) (Oral)  Resp 18  SpO2 96% Physical Exam   Nursing note and vitals reviewed. Constitutional: She is oriented to person, place, and time. She appears well-developed and well-nourished.  HENT:  Head: Normocephalic and atraumatic.  Upper lip swelling;  normal oropharynx  Eyes: Conjunctivae and EOM are normal. Pupils are equal, round, and reactive to light.  Neck: Normal range of motion. Neck supple.  Cardiovascular: Normal rate, regular rhythm and normal heart sounds.   Pulmonary/Chest: Effort normal and breath sounds normal.  No wheezing.  Abdominal: Soft. Bowel sounds are normal.  Musculoskeletal: Normal range of motion.  Neurological: She is alert and oriented to person, place, and time.  Skin: Skin is warm and dry.  Psychiatric: She has a normal mood and affect. Her behavior is normal.    ED Course  Procedures (including critical care time) Labs Review Labs Reviewed - No data to display Imaging Review No results found.  EKG Interpretation   None       MDM   1. Allergic reaction    Patient feels much better after IV Solu Medrol and PO Benadryl.   Recommend dose of prednisone and Benadryl tonight.  No new antibiotic at this time. Patient will see oncologist tomorrow.    Nat Christen, MD 04/07/13 2033

## 2013-04-07 NOTE — Progress Notes (Signed)
   [place]    OFFICE PROGRESS NOTE   INTERVAL HISTORY:   She completed a final cycle of chemotherapy beginning on 03/28/2013. She completed radiation 04/04/2013. [name] reports developing dysuria last weekend. A urinalysis confirmed pyuria and she was prescribed Macrodantin by Dr. Lisbeth Renshaw. The dysuria has improved. No fever.  She had nausea/vomiting and diarrhea beginning on 04/03/2013. This has resolved. She has pain at the perineum. No mouth ulcers. No hand or foot pain.  Objective:  Vital signs in last 24 hours:  Blood pressure 110/67, pulse 119, temperature 98.1 F (36.7 C), temperature source Oral, resp. rate 18, height 5\' 3"  (1.6 m), weight 121 lb 3.2 oz (54.976 kg).    HEENT: No thrush or ulcers. Resp: Lungs clear bilaterally Cardio: Regular rate and rhythm, tachycardia GI: No hepatosplenomegaly, nontender, no mass Vascular: No leg edema  Skin: Diminished skin turgor, erythema at the bilateral gluteal areas and upper thighs. Erythema with ulceration at the perineum     Lab Results:  Lab Results  Component Value Date   WBC 0.6* 04/07/2013   HGB 9.2* 04/07/2013   HCT 26.4* 04/07/2013   MCV 88.0 04/07/2013   PLT 122* 04/07/2013   NEUTROABS 0.1* 04/07/2013      Medications: I have reviewed the patient's current medications.  Assessment/Plan: 1. 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  Staging CT of the abdomen/pelvis 01/24/2013 revealed small perirectal lymph nodes and a 6 mm left internal iliac node.  Radiation and cycle 1 5-FU/mitomycin C. initiated on 02/21/2013. Cycle 2 5-FU/mitomycin-C 03/28/2013, radiation completed 04/04/2013 2. Pericarditis 2006. 3. History of thrombocytopenia. Question ITP. 4. Status post TAH/BSO. 5. Family history multiple cancers. 6. History of Mucositis secondary to chemotherapy. 7. Pancytopenia secondary chemotherapy 8. Dysuria-UTI versus radiation cystitis 9. Rash and  skin breakdown at the perineum/groin-she will use a topical barrier cream 10. Dehydration-she will receive intravenous fluids today. We will check a chemistry panel. 11. Nausea/vomiting and diarrhea following cycle 2 5-FU and mitomycin C., resolved   Disposition:  She is now at day 11 following cycle 2 5-FU/mitomycin-C. She has severe neutropenia. She will begin prophylactic ciprofloxacin. She knows to contact us for a fever or symptoms of infection.  Ms. Sposito has skin breakdown at the perineum. She will use a barrier cream. We prescribed hydrocodone for pain.  She appears dehydrated today. She will receive intravenous fluids today and again on 04/08/2013. We encouraged her to push oral hydration. We will check an electrolyte panel today.  Ms. Salminen will return for an office visit on 04/13/2013.   Betsy Coder, MD  04/07/2013  10:44 AM

## 2013-04-08 ENCOUNTER — Other Ambulatory Visit (HOSPITAL_BASED_OUTPATIENT_CLINIC_OR_DEPARTMENT_OTHER): Payer: BC Managed Care – PPO

## 2013-04-08 ENCOUNTER — Other Ambulatory Visit: Payer: Self-pay | Admitting: *Deleted

## 2013-04-08 ENCOUNTER — Telehealth: Payer: Self-pay | Admitting: Oncology

## 2013-04-08 ENCOUNTER — Ambulatory Visit (HOSPITAL_BASED_OUTPATIENT_CLINIC_OR_DEPARTMENT_OTHER): Payer: BC Managed Care – PPO

## 2013-04-08 ENCOUNTER — Ambulatory Visit: Payer: BC Managed Care – PPO

## 2013-04-08 DIAGNOSIS — C211 Malignant neoplasm of anal canal: Secondary | ICD-10-CM

## 2013-04-08 DIAGNOSIS — E860 Dehydration: Secondary | ICD-10-CM

## 2013-04-08 LAB — CBC WITH DIFFERENTIAL/PLATELET
BASO%: 0 % (ref 0.0–2.0)
Basophils Absolute: 0 10*3/uL (ref 0.0–0.1)
EOS%: 0 % (ref 0.0–7.0)
Eosinophils Absolute: 0 10*3/uL (ref 0.0–0.5)
HEMATOCRIT: 25.6 % — AB (ref 34.8–46.6)
HGB: 8.7 g/dL — ABNORMAL LOW (ref 11.6–15.9)
LYMPH%: 19.1 % (ref 14.0–49.7)
MCH: 29.8 pg (ref 25.1–34.0)
MCHC: 34 g/dL (ref 31.5–36.0)
MCV: 87.7 fL (ref 79.5–101.0)
MONO#: 0.5 10*3/uL (ref 0.1–0.9)
MONO%: 47.3 % — ABNORMAL HIGH (ref 0.0–14.0)
NEUT#: 0.4 10*3/uL — CL (ref 1.5–6.5)
NEUT%: 33.6 % — ABNORMAL LOW (ref 38.4–76.8)
Platelets: 114 10*3/uL — ABNORMAL LOW (ref 145–400)
RBC: 2.92 10*6/uL — AB (ref 3.70–5.45)
RDW: 14.4 % (ref 11.2–14.5)
WBC: 1.1 10*3/uL — AB (ref 3.9–10.3)
lymph#: 0.2 10*3/uL — ABNORMAL LOW (ref 0.9–3.3)

## 2013-04-08 LAB — BASIC METABOLIC PANEL (CC13)
Anion Gap: 10 mEq/L (ref 3–11)
BUN: 11.8 mg/dL (ref 7.0–26.0)
CO2: 24 mEq/L (ref 22–29)
CREATININE: 0.8 mg/dL (ref 0.6–1.1)
Calcium: 8.6 mg/dL (ref 8.4–10.4)
Chloride: 102 mEq/L (ref 98–109)
Glucose: 132 mg/dl (ref 70–140)
POTASSIUM: 3.8 meq/L (ref 3.5–5.1)
Sodium: 136 mEq/L (ref 136–145)

## 2013-04-08 MED ORDER — SODIUM CHLORIDE 0.9 % IV SOLN
Freq: Once | INTRAVENOUS | Status: DC
  Administered 2013-04-08: 11:00:00 via INTRAVENOUS

## 2013-04-08 NOTE — Telephone Encounter (Signed)
s.w pt and advised on Jan lab b4 visit nxt week...pt ok and aware

## 2013-04-08 NOTE — Patient Instructions (Signed)
Dehydration, Adult  Dehydration means your body does not have as much fluid as it needs. Your kidneys, brain, and heart will not work properly without the right amount of fluids and salt.   HOME CARE   Ask your doctor how to replace body fluid losses (rehydrate).   Drink enough fluids to keep your pee (urine) clear or pale yellow.   Drink small amounts of fluids often if you feel sick to your stomach (nauseous) or throw up (vomit).   Eat like you normally do.   Avoid:   Foods or drinks high in sugar.   Bubbly (carbonated) drinks.   Juice.   Very hot or cold fluids.   Drinks with caffeine.   Fatty, greasy foods.   Alcohol.   Tobacco.   Eating too much.   Gelatin desserts.   Wash your hands to avoid spreading germs (bacteria, viruses).   Only take medicine as told by your doctor.   Keep all doctor visits as told.  GET HELP RIGHT AWAY IF:    You cannot drink something without throwing up.   You get worse even with treatment.   Your vomit has blood in it or looks greenish.   Your poop (stool) has blood in it or looks black and tarry.   You have not peed in 6 to 8 hours.   You pee a small amount of very dark pee.   You have a fever.   You pass out (faint).   You have belly (abdominal) pain that gets worse or stays in one spot (localizes).   You have a rash, stiff neck, or bad headache.   You get easily annoyed, sleepy, or are hard to wake up.   You feel weak, dizzy, or very thirsty.  MAKE SURE YOU:    Understand these instructions.   Will watch your condition.   Will get help right away if you are not doing well or get worse.  Document Released: 12/28/2008 Document Revised: 05/26/2011 Document Reviewed: 10/21/2010  [employer] Patient Information 2014 [employer].

## 2013-04-09 ENCOUNTER — Emergency Department (HOSPITAL_COMMUNITY)
Admission: EM | Admit: 2013-04-09 | Discharge: 2013-04-09 | Disposition: A | Discharge status: Home / Self Care | Payer: BC Managed Care – PPO | Attending: Emergency Medicine | Admitting: Emergency Medicine

## 2013-04-09 ENCOUNTER — Encounter (HOSPITAL_COMMUNITY): Payer: Self-pay | Admitting: Emergency Medicine

## 2013-04-09 DIAGNOSIS — Z8739 Personal history of other diseases of the musculoskeletal system and connective tissue: Secondary | ICD-10-CM | POA: Insufficient documentation

## 2013-04-09 DIAGNOSIS — Z79899 Other long term (current) drug therapy: Secondary | ICD-10-CM | POA: Insufficient documentation

## 2013-04-09 DIAGNOSIS — Z8679 Personal history of other diseases of the circulatory system: Secondary | ICD-10-CM | POA: Insufficient documentation

## 2013-04-09 DIAGNOSIS — Z9109 Other allergy status, other than to drugs and biological substances: Secondary | ICD-10-CM | POA: Insufficient documentation

## 2013-04-09 DIAGNOSIS — Z90710 Acquired absence of both cervix and uterus: Secondary | ICD-10-CM | POA: Insufficient documentation

## 2013-04-09 DIAGNOSIS — C20 Malignant neoplasm of rectum: Secondary | ICD-10-CM | POA: Insufficient documentation

## 2013-04-09 DIAGNOSIS — Z9889 Other specified postprocedural states: Secondary | ICD-10-CM | POA: Insufficient documentation

## 2013-04-09 DIAGNOSIS — Z8742 Personal history of other diseases of the female genital tract: Secondary | ICD-10-CM | POA: Insufficient documentation

## 2013-04-09 DIAGNOSIS — K625 Hemorrhage of anus and rectum: Secondary | ICD-10-CM | POA: Insufficient documentation

## 2013-04-09 DIAGNOSIS — Z880 Allergy status to penicillin: Secondary | ICD-10-CM | POA: Insufficient documentation

## 2013-04-09 DIAGNOSIS — R197 Diarrhea, unspecified: Secondary | ICD-10-CM | POA: Insufficient documentation

## 2013-04-09 LAB — CBC WITH DIFFERENTIAL/PLATELET
BASOS ABS: 0 10*3/uL (ref 0.0–0.1)
Basophils Relative: 1 % (ref 0–1)
Eosinophils Absolute: 0.1 10*3/uL (ref 0.0–0.7)
Eosinophils Relative: 5 % (ref 0–5)
HCT: 26.1 % — ABNORMAL LOW (ref 36.0–46.0)
Hemoglobin: 8.8 g/dL — ABNORMAL LOW (ref 12.0–15.0)
LYMPHS PCT: 13 % (ref 12–46)
Lymphs Abs: 0.2 10*3/uL — ABNORMAL LOW (ref 0.7–4.0)
MCH: 30.2 pg (ref 26.0–34.0)
MCHC: 33.7 g/dL (ref 30.0–36.0)
MCV: 89.7 fL (ref 78.0–100.0)
MONOS PCT: 43 % — AB (ref 3–12)
Monocytes Absolute: 0.5 10*3/uL (ref 0.1–1.0)
Neutro Abs: 0.5 10*3/uL — ABNORMAL LOW (ref 1.7–7.7)
Neutrophils Relative %: 38 % — ABNORMAL LOW (ref 43–77)
PLATELETS: 84 10*3/uL — AB (ref 150–400)
RBC: 2.91 MIL/uL — AB (ref 3.87–5.11)
RDW: 14.7 % (ref 11.5–15.5)
WBC Morphology: INCREASED
WBC: 1.3 10*3/uL — AB (ref 4.0–10.5)

## 2013-04-09 LAB — URINALYSIS, ROUTINE W REFLEX MICROSCOPIC
Bilirubin Urine: NEGATIVE
Glucose, UA: NEGATIVE mg/dL
Hgb urine dipstick: NEGATIVE
Ketones, ur: 15 mg/dL — AB
LEUKOCYTES UA: NEGATIVE
Nitrite: NEGATIVE
Protein, ur: NEGATIVE mg/dL
Specific Gravity, Urine: 1.016 (ref 1.005–1.030)
Urobilinogen, UA: 0.2 mg/dL (ref 0.0–1.0)
pH: 6 (ref 5.0–8.0)

## 2013-04-09 LAB — COMPREHENSIVE METABOLIC PANEL
ALT: 8 U/L (ref 0–35)
AST: 13 U/L (ref 0–37)
Albumin: 2.6 g/dL — ABNORMAL LOW (ref 3.5–5.2)
Alkaline Phosphatase: 50 U/L (ref 39–117)
BUN: 12 mg/dL (ref 6–23)
CO2: 24 mEq/L (ref 19–32)
Calcium: 8 mg/dL — ABNORMAL LOW (ref 8.4–10.5)
Chloride: 102 mEq/L (ref 96–112)
Creatinine, Ser: 0.65 mg/dL (ref 0.50–1.10)
GFR calc Af Amer: >90 mL/min (ref >90)
GFR calc non Af Amer: >90 mL/min (ref >90)
Glucose, Bld: 92 mg/dL (ref 70–99)
POTASSIUM: 3.3 meq/L — AB (ref 3.7–5.3)
SODIUM: 137 meq/L (ref 137–147)
TOTAL PROTEIN: 6.1 g/dL (ref 6.0–8.3)
Total Bilirubin: 0.5 mg/dL (ref 0.3–1.2)

## 2013-04-09 MED ORDER — SODIUM CHLORIDE 0.9 % IV BOLUS (SEPSIS)
1000.0000 mL | Freq: Once | INTRAVENOUS | Status: AC
  Administered 2013-04-09: 1000 mL via INTRAVENOUS

## 2013-04-09 MED ORDER — MORPHINE SULFATE 4 MG/ML IJ SOLN
4.0000 mg | Freq: Once | INTRAMUSCULAR | Status: AC
  Administered 2013-04-09: 4 mg via INTRAVENOUS
  Filled 2013-04-09: qty 1

## 2013-04-09 MED ORDER — AQUAPHOR EX OINT: TOPICAL_OINTMENT | CUTANEOUS | Status: DC | PRN

## 2013-04-09 MED ORDER — LOPERAMIDE HCL 2 MG PO CAPS: 2.0000 mg | ORAL_CAPSULE | Freq: Four times a day (QID) | ORAL | Status: DC | PRN

## 2013-04-09 MED ORDER — POTASSIUM CHLORIDE CRYS ER 20 MEQ PO TBCR
40.0000 meq | EXTENDED_RELEASE_TABLET | Freq: Once | ORAL | Status: AC
  Administered 2013-04-09: 40 meq via ORAL
  Filled 2013-04-09: qty 2

## 2013-04-09 MED ORDER — LOPERAMIDE HCL 2 MG PO CAPS
2.0000 mg | ORAL_CAPSULE | Freq: Once | ORAL | Status: AC
  Administered 2013-04-09: 2 mg via ORAL
  Filled 2013-04-09: qty 1

## 2013-04-09 MED ORDER — HYDROCERIN EX CREA
TOPICAL_CREAM | CUTANEOUS | Status: DC | PRN
  Administered 2013-04-09: 18:00:00 via TOPICAL
  Filled 2013-04-09: qty 113

## 2013-04-09 NOTE — ED Notes (Signed)
Pt from home reports that she has had diarrhea since 8pm last night. Pt is being tx for reactal CA, last tx was 04-04-13. Pt is A&O and in NAD

## 2013-04-09 NOTE — ED Provider Notes (Signed)
CSN: [id number]     Arrival date & time 04/09/13  1404 History   First MD Initiated Contact with Patient 04/09/13 1547     Chief Complaint  Patient presents with  . Diarrhea   (Consider location/radiation/quality/duration/timing/severity/associated sxs/prior Treatment) HPI Comments: Patient is a 63 yo F PMHx significant for rectal cancer undergoing radiation therapy (last treatment on Monday) presenting to the ED for acute onset watery diarrhea that began last evening around 8PM. Patient states she took four Imodium last evening without relief. She took one Imodium around 1PM today and that has slowed the diarrhea. Patient states her rectum is very raw from the diarrhea and radiation and is causing some light bleeding. Patient denies any dark black stools. Patient had diarrhea on Monday and Tuesday similar to today. She was seen in clinic on Thursday given IV fluids and had blood work drawn, patient states Dr. Benay Spice her oncologist did note her WBC count had dropped. Denies any nausea, vomiting, abdominal pain, fevers.    Past Medical History  Diagnosis Date  . Perimenopausal   . Myocarditis, unspecified   . Unspecified disease of pericardium   . Pericardial effusion   . Pericarditis   . Headache(784.0)   . Plantar fasciitis of right foot   . Allergic sinusitis   . Pericarditis     history 2006 from virus  . Cancer 01/21/13    rectum   Past Surgical History  Procedure Laterality Date  . Pericardial window on november 11,2006, by dr bartle    . Other surgical history      post hysterectomy 1991  . Abdominal hysterectomy    . Colonoscopy    . Orif clavicular fracture  07/15/2011    Procedure: OPEN REDUCTION INTERNAL FIXATION (ORIF) CLAVICULAR FRACTURE;  Surgeon: Nita Sells, MD;  Location: [facility];  Service: Orthopedics;  Laterality: Right;   Family History  Problem Relation Age of Onset  . Coronary artery disease Sister     stented at age 51  .  Cancer Sister 61    melanoma  . Cancer Father     bladder  . Cancer Maternal Aunt 68    pancreas  . Cancer Paternal Aunt 34    breast cancer   History  Substance Use Topics  . Smoking status: Never Smoker   . Smokeless tobacco: Not on file  . Alcohol Use: Yes     Comment: occasional   OB History   Grav Para Term Preterm Abortions TAB SAB Ect Mult Living                 Review of Systems  Constitutional: Negative for fever and chills.  Respiratory: Negative for shortness of breath.   Cardiovascular: Negative for chest pain.  Gastrointestinal: Positive for diarrhea and rectal pain. Negative for nausea, vomiting, abdominal pain, constipation and blood in stool.  All other systems reviewed and are negative.    Allergies  Ciprofloxacin and Penicillins  Home Medications   Current Outpatient Rx  Name  Route  Sig  Dispense  Refill  . Alum & Mag Hydroxide-Simeth (MAGIC MOUTHWASH) SOLN   Oral   Take 10 mLs by mouth 4 (four) times daily as needed for mouth pain.   240 mL   1   . Cholecalciferol 10000 UNITS CAPS   Oral   Take 1,000 Units by mouth daily.         . Cyanocobalamin 1500 MCG TBDP   Oral   Take 1,500 mcg  by mouth daily.         Marland Kitchen estrogens, conjugated, (PREMARIN) 0.625 MG tablet   Oral   Take 0.625 mg by mouth daily.          . hydrochlorothiazide (MICROZIDE) 12.5 MG capsule   Oral   Take 12.5 mg by mouth daily.         Marland Kitchen HYDROcodone-acetaminophen (NORCO/VICODIN) 5-325 MG per tablet   Oral   Take 1 tablet by mouth every 6 (six) hours as needed for moderate pain.   50 tablet   0   . loperamide (IMODIUM) 2 MG capsule   Oral   Take 2 mg by mouth as needed for diarrhea or loose stools.         . naproxen sodium (ANAPROX) 220 MG tablet   Oral   Take 220 mg by mouth 2 (two) times daily as needed (pain).          . Omega-3 Fatty Acids (FISH OIL BURP-LESS PO)   Oral   Take 1 tablet by mouth daily.           . prochlorperazine  (COMPAZINE) 10 MG tablet   Oral   Take 1 tablet (10 mg total) by mouth every 6 (six) hours as needed for nausea.   60 tablet   1   . traMADol (ULTRAM) 50 MG tablet   Oral   Take 1 tablet (50 mg total) by mouth every 8 (eight) hours as needed. For pain   50 tablet   0   . loperamide (IMODIUM) 2 MG capsule   Oral   Take 1 capsule (2 mg total) by mouth 4 (four) times daily as needed for diarrhea or loose stools. Initial onset of diarrhea: 4 mg followed by 2 mg every 4 hours or after each unformed stool; Maximum: 16 mg/day (Benson, 2004) or 4 mg followed by 2 mg every 2 hours (4 mg every 4 hours at night) until 12 hours have passed without a loose bowel movement   12 capsule   0    BP 134/65  Pulse 96  Temp(Src) 98.7 F (37.1 C) (Oral)  Resp 18  SpO2 100% Physical Exam  Constitutional: She is oriented to person, place, and time. She appears well-developed and well-nourished. No distress.  HENT:  Head: Normocephalic and atraumatic.  Right Ear: External ear normal.  Left Ear: External ear normal.  Nose: Nose normal.  Mouth/Throat: Oropharynx is clear and moist. No oropharyngeal exudate.  Eyes: Conjunctivae are normal.  Neck: Normal range of motion. Neck supple.  Cardiovascular: Normal rate, regular rhythm and normal heart sounds.   Pulmonary/Chest: Effort normal and breath sounds normal. No respiratory distress.  Abdominal: Soft. Bowel sounds are normal. She exhibits no distension. There is no tenderness. There is no rebound and no guarding.  Genitourinary: Rectal exam shows no external hemorrhoid.  Musculoskeletal: Normal range of motion. She exhibits no edema.  Neurological: She is alert and oriented to person, place, and time.  Skin: Skin is warm and dry. She is not diaphoretic.     Chronic skin changes from radiation therapy noted in areas marked.   Psychiatric: She has a normal mood and affect.    ED Course  Procedures (including critical care time) Medications    hydrocerin (EUCERIN) cream ( Topical Given 04/09/13 1819)  sodium chloride 0.9 % bolus 1,000 mL (0 mLs Intravenous Stopped 04/09/13 1800)  morphine 4 MG/ML injection 4 mg (4 mg Intravenous Given 04/09/13 1649)  sodium chloride 0.9 %  bolus 1,000 mL (0 mLs Intravenous Stopped 04/09/13 1958)  potassium chloride SA (K-DUR,KLOR-CON) CR tablet 40 mEq (40 mEq Oral Given 04/09/13 1859)  loperamide (IMODIUM) capsule 2 mg (2 mg Oral Given 04/09/13 2000)    Labs Review Labs Reviewed  CBC WITH DIFFERENTIAL - Abnormal; Notable for the following:    WBC 1.3 (*)    RBC 2.91 (*)    Hemoglobin 8.8 (*)    HCT 26.1 (*)    Platelets 84 (*)    Neutrophils Relative % 38 (*)    Monocytes Relative 43 (*)    Neutro Abs 0.5 (*)    Lymphs Abs 0.2 (*)    All other components within normal limits  COMPREHENSIVE METABOLIC PANEL - Abnormal; Notable for the following:    Potassium 3.3 (*)    Calcium 8.0 (*)    Albumin 2.6 (*)    All other components within normal limits  URINALYSIS, ROUTINE W REFLEX MICROSCOPIC - Abnormal; Notable for the following:    Ketones, ur 15 (*)    All other components within normal limits   Imaging Review No results found.  EKG Interpretation   None       MDM   1. Diarrhea     Filed Vitals:   04/09/13 2001  BP:   Pulse:   Temp: 98.7 F (37.1 C)  Resp:     Afebrile, NAD, non-toxic appearing, AAOx4. Abdomen soft, non-tender, non-distended w/o guarding, rebound, rigidity. No peritoneal signs. Patient with chronic skin changes from radiation therapy for rectal cancer. No bleeding appreciated. Rectal exam otherwise normal. Labs reviewed. Regas blood cell count improvement, 22nd and 23rd from lab checks with Dr. Benay Spice at outpatient visits to [facility]. Patient is feeling better after IVF, pain medication, and Eucerin cream. Patient endorses improvement of her symptoms after IVF and pain medication. With patient being afebrile, benign abdomen and improvement of lab  results, along with patient desire to go home will discharge patient with Imodium and return precautions. Return precautions discussed. Patient is agreeable to plan. Patient is stable at time of discharge. Patient d/w with Dr. Tawnya Crook, agrees with plan.        Harlow Mares, PA-C 04/10/13 0017

## 2013-04-09 NOTE — Discharge Instructions (Signed)
Please follow up with your primary care physician in 1-2 days. If you do not have one please call the [place] number listed above. Please follow up with Dr. Benay Spice to schedule a follow up appointment. Please take Imodium as prescribed, 4 mg followed by 2 mg every 4 hours or after each unformed stool; Maximum: 16 mg/day Britta Mccreedy, 2004) or 4 mg followed by 2 mg every 2 hours (4 mg every 4 hours at night) until 12 hours have passed without a loose bowel movement. Please read all discharge instructions and return precautions.   Diarrhea Diarrhea is frequent loose and watery bowel movements. It can cause you to feel weak and dehydrated. Dehydration can cause you to become tired and thirsty, have a dry mouth, and have decreased urination that often is dark yellow. Diarrhea is a sign of another problem, most often an infection that will not last long. In most cases, diarrhea typically lasts 2 3 days. However, it can last longer if it is a sign of something more serious. It is important to treat your diarrhea as directed by your caregive to lessen or prevent future episodes of diarrhea. CAUSES  Some common causes include:  Gastrointestinal infections caused by viruses, bacteria, or parasites.  Food poisoning or food allergies.  Certain medicines, such as antibiotics, chemotherapy, and laxatives.  Artificial sweeteners and fructose.  Digestive disorders. HOME CARE INSTRUCTIONS  Ensure adequate fluid intake (hydration): have 1 cup (8 oz) of fluid for each diarrhea episode. Avoid fluids that contain simple sugars or sports drinks, fruit juices, whole milk products, and sodas. Your urine should be clear or pale yellow if you are drinking enough fluids. Hydrate with an oral rehydration solution that you can purchase at pharmacies, retail stores, and online. You can prepare an oral rehydration solution at home by mixing the following ingredients together:    tsp table salt.   tsp  baking soda.   tsp salt substitute containing potassium chloride.  1  tablespoons sugar.  1 L (34 oz) of water.  Certain foods and beverages may increase the speed at which food moves through the gastrointestinal (GI) tract. These foods and beverages should be avoided and include:  Caffeinated and alcoholic beverages.  High-fiber foods, such as raw fruits and vegetables, nuts, seeds, and whole grain breads and cereals.  Foods and beverages sweetened with sugar alcohols, such as xylitol, sorbitol, and mannitol.  Some foods may be well tolerated and may help thicken stool including:  Starchy foods, such as rice, toast, pasta, low-sugar cereal, oatmeal, grits, baked potatoes, crackers, and bagels.  Bananas.  Applesauce.  Add probiotic-rich foods to help increase healthy bacteria in the GI tract, such as yogurt and fermented milk products.  Wash your hands well after each diarrhea episode.  Only take over-the-counter or prescription medicines as directed by your caregiver.  Take a warm bath to relieve any burning or pain from frequent diarrhea episodes. SEEK IMMEDIATE MEDICAL CARE IF:   You are unable to keep fluids down.  You have persistent vomiting.  You have blood in your stool, or your stools are black and tarry.  You do not urinate in 6 8 hours, or there is only a small amount of very dark urine.  You have abdominal pain that increases or localizes.  You have weakness, dizziness, confusion, or lightheadedness.  You have a severe headache.  Your diarrhea gets worse or does not get better.  You have a fever or persistent symptoms for more  than 2 3 days.  You have a fever and your symptoms suddenly get worse. MAKE SURE YOU:   Understand these instructions.  Will watch your condition.  Will get help right away if you are not doing well or get worse. Document Released: 02/21/2002 Document Revised: 02/18/2012 Document Reviewed: 11/09/2011 [facility] Patient  Information 2014 [place], [place].

## 2013-04-10 NOTE — ED Provider Notes (Signed)
Medical screening examination/treatment/procedure(s) were conducted as a shared visit with non-physician practitioner(s) and myself.  I personally evaluated the patient during the encounter. Pt presenting w/ d/a s/p chemo. On my PE, pt feels much improved after IVF, has no complaint. VSS,  Cardiopulm & abdominal exam benign. Pt safe for d/c, will inc immodium dose.   EKG Interpretation   None         Neta Ehlers, MD 04/10/13 [phone]

## 2013-04-12 ENCOUNTER — Telehealth: Payer: Self-pay | Admitting: Oncology

## 2013-04-12 LAB — PATHOLOGIST SMEAR REVIEW

## 2013-04-13 ENCOUNTER — Telehealth: Payer: Self-pay | Admitting: Oncology

## 2013-04-13 ENCOUNTER — Other Ambulatory Visit (HOSPITAL_BASED_OUTPATIENT_CLINIC_OR_DEPARTMENT_OTHER): Payer: BC Managed Care – PPO

## 2013-04-13 ENCOUNTER — Ambulatory Visit (HOSPITAL_BASED_OUTPATIENT_CLINIC_OR_DEPARTMENT_OTHER): Payer: BC Managed Care – PPO | Admitting: Nurse Practitioner

## 2013-04-13 VITALS — BP 126/60 | HR 102 | Temp 98.0°F | Resp 18 | Ht 63.0 in | Wt 124.2 lb

## 2013-04-13 DIAGNOSIS — D696 Thrombocytopenia, unspecified: Secondary | ICD-10-CM

## 2013-04-13 DIAGNOSIS — R21 Rash and other nonspecific skin eruption: Secondary | ICD-10-CM

## 2013-04-13 DIAGNOSIS — C20 Malignant neoplasm of rectum: Secondary | ICD-10-CM

## 2013-04-13 DIAGNOSIS — C211 Malignant neoplasm of anal canal: Secondary | ICD-10-CM

## 2013-04-13 DIAGNOSIS — E876 Hypokalemia: Secondary | ICD-10-CM

## 2013-04-13 DIAGNOSIS — R609 Edema, unspecified: Secondary | ICD-10-CM

## 2013-04-13 DIAGNOSIS — D61818 Other pancytopenia: Secondary | ICD-10-CM

## 2013-04-13 DIAGNOSIS — L988 Other specified disorders of the skin and subcutaneous tissue: Secondary | ICD-10-CM

## 2013-04-13 LAB — TECHNOLOGIST REVIEW

## 2013-04-13 LAB — CBC WITH DIFFERENTIAL/PLATELET
BASO%: 0.4 % (ref 0.0–2.0)
BASOS ABS: 0 10*3/uL (ref 0.0–0.1)
EOS ABS: 0 10*3/uL (ref 0.0–0.5)
EOS%: 0.4 % (ref 0.0–7.0)
HCT: 23 % — ABNORMAL LOW (ref 34.8–46.6)
HGB: 7.9 g/dL — ABNORMAL LOW (ref 11.6–15.9)
LYMPH#: 0.3 10*3/uL — AB (ref 0.9–3.3)
LYMPH%: 12.7 % — ABNORMAL LOW (ref 14.0–49.7)
MCH: 29.9 pg (ref 25.1–34.0)
MCHC: 34.3 g/dL (ref 31.5–36.0)
MCV: 87.1 fL (ref 79.5–101.0)
MONO#: 0.3 10*3/uL (ref 0.1–0.9)
MONO%: 13.2 % (ref 0.0–14.0)
NEUT%: 73.3 % (ref 38.4–76.8)
NEUTROS ABS: 1.7 10*3/uL (ref 1.5–6.5)
Platelets: 49 10*3/uL — ABNORMAL LOW (ref 145–400)
RBC: 2.64 10*6/uL — ABNORMAL LOW (ref 3.70–5.45)
RDW: 15.3 % — AB (ref 11.2–14.5)
WBC: 2.3 10*3/uL — ABNORMAL LOW (ref 3.9–10.3)
nRBC: 0 % (ref 0–0)

## 2013-04-13 NOTE — Progress Notes (Signed)
  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 This treatment was carried out using IMRT with daily image guidance.  Narrative: The patient tolerated radiation treatment relatively well.   The patient had some irritation in the anal/rectal region as expected. Overall she completed her treatment without significant delay.  Plan: The patient has completed radiation treatment. The patient will return to radiation oncology clinic for routine followup in one month. I advised the patient to call or return sooner if they have any questions or concerns related to their recovery or treatment. ________________________________  Jodelle Gross, M.D., Ph.D.

## 2013-04-13 NOTE — Progress Notes (Signed)
OFFICE PROGRESS NOTE  Interval history:  [name] returns for followup of anal cancer. She was seen in the emergency Department on 04/07/2013 for an allergic reaction to Cipro. She reports developing "mouth tingling and swelling" about 20-30 minutes after the initial dose. She took Benadryl at home and received steroids in the emergency Department. She was seen again in the emergency Department on 04/09/2013 with diarrhea not responsive to Imodium. She received IV fluids.  The diarrhea is better. She continues to have loose stools but not watery. She is taking Imodium as needed with good control. She denies nausea/vomiting. No mouth sores. No fever. She continues to have pain at the perineum/rectum. She takes tramadol and naproxen as needed. Dysuria has resolved. She has noted recent swelling of both legs. This occurred following discontinuation of hydrochlorothiazide.   Objective: Filed Vitals:   04/13/13 1135  BP: 126/60  Pulse: 102  Temp: 98 F (36.7 C)  Resp: 18   Oropharynx is without thrush or ulceration. Lungs are clear. Regular cardiac rhythm. Abdomen soft and nontender. No organomegaly. Trace to 1+ pitting edema at the lower legs bilaterally right slightly greater than left. Calves are soft and nontender. Desquamation/erythema at the bilateral inguinal areas and upper thighs. Erythema with ulceration at the perineum.   Lab Results: Lab Results  Component Value Date   WBC 2.3* 04/13/2013   HGB 7.9* 04/13/2013   HCT 23.0* 04/13/2013   MCV 87.1 04/13/2013   PLT 49* 04/13/2013   NEUTROABS 1.7 04/13/2013    Chemistry:    Chemistry      Component Value Date/Time   NA 137 04/09/2013 1653   NA 136 04/08/2013 1021   K 3.3* 04/09/2013 1653   K 3.8 04/08/2013 1021   CL 102 04/09/2013 1653   CO2 24 04/09/2013 1653   CO2 24 04/08/2013 1021   BUN 12 04/09/2013 1653   BUN 11.8 04/08/2013 1021   CREATININE 0.65 04/09/2013 1653   CREATININE 0.8 04/08/2013 1021      Component Value Date/Time   CALCIUM 8.0* 04/09/2013 1653   CALCIUM 8.6 04/08/2013 1021   ALKPHOS 50 04/09/2013 1653   ALKPHOS 44 03/28/2013 1109   AST 13 04/09/2013 1653   AST 21 03/28/2013 1109   ALT 8 04/09/2013 1653   ALT 15 03/28/2013 1109   BILITOT 0.5 04/09/2013 1653   BILITOT 0.62 03/28/2013 1109       Studies/Results: Ir Fluoro Guide Cv Line Right  03/28/2013   CLINICAL DATA:  Anal cancer; central venous access is requested for chemotherapy.  EXAM: IR RIGHT FLOURO GUIDE CV LINE; IR ULTRASOUND GUIDANCE VASC ACCESS RIGHT  TECHNIQUE: The right arm was prepped with chlorhexidine, draped in the usual sterile fashion using maximum barrier technique (cap and mask, sterile gown, sterile gloves, large sterile sheet, hand hygiene and cutaneous antiseptic). Local anesthesia was attained by infiltration with 1% lidocaine.  Ultrasound demonstrated patency of the right basilic vein, and this was documented with an image. Under real-time ultrasound guidance, this vein was accessed with a 21 gauge micropuncture needle and image documentation was performed. The needle was exchanged over a guidewire for a peel-away sheath through which a 38 cm 5 [place] single lumen power injectable PICC was advanced, and positioned with its tip at the lower SVC/right atrial junction. Fluoroscopy during the procedure and fluoro spot radiograph confirms appropriate catheter position. The catheter was flushed, secured to the skin with Prolene sutures, and covered with a sterile dressing.  FLUOROSCOPY TIME:  48 seconds  COMPLICATIONS: None.  The patient tolerated the procedure well.  IMPRESSION: Successful placement of a right arm PICC with sonographic and fluoroscopic guidance. The catheter is ready for use.  Read by: Rowe Robert ,P.A.-C.   Electronically Signed   By: Aletta Edouard M.D.   On: 03/28/2013 13:58   Ir US Guide Vasc Access Right  03/28/2013   CLINICAL DATA:  Anal cancer; central venous access is requested for chemotherapy.  EXAM: IR RIGHT FLOURO  GUIDE CV LINE; IR ULTRASOUND GUIDANCE VASC ACCESS RIGHT  TECHNIQUE: The right arm was prepped with chlorhexidine, draped in the usual sterile fashion using maximum barrier technique (cap and mask, sterile gown, sterile gloves, large sterile sheet, hand hygiene and cutaneous antiseptic). Local anesthesia was attained by infiltration with 1% lidocaine.  Ultrasound demonstrated patency of the right basilic vein, and this was documented with an image. Under real-time ultrasound guidance, this vein was accessed with a 21 gauge micropuncture needle and image documentation was performed. The needle was exchanged over a guidewire for a peel-away sheath through which a 38 cm 5 [place] single lumen power injectable PICC was advanced, and positioned with its tip at the lower SVC/right atrial junction. Fluoroscopy during the procedure and fluoro spot radiograph confirms appropriate catheter position. The catheter was flushed, secured to the skin with Prolene sutures, and covered with a sterile dressing.  FLUOROSCOPY TIME:  48 seconds  COMPLICATIONS: None.  The patient tolerated the procedure well.  IMPRESSION: Successful placement of a right arm PICC with sonographic and fluoroscopic guidance. The catheter is ready for use.  Read by: Rowe Robert ,P.A.-C.   Electronically Signed   By: Aletta Edouard M.D.   On: 03/28/2013 13:58    Medications: I have reviewed the patient's current medications.  Assessment/Plan: 1. 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  Staging CT of the abdomen/pelvis 01/24/2013 revealed small perirectal lymph nodes and a 6 mm left internal iliac node.  Radiation and cycle 1 5-FU/mitomycin C. initiated on 02/21/2013.  Cycle 2 5-FU/mitomycin-C 03/28/2013, radiation completed 04/04/2013. 2. Pericarditis 2006. 3. History of thrombocytopenia. Question ITP. 4. Status post TAH/BSO. 5. Family history multiple cancers. 6. History of mucositis secondary to  chemotherapy. 7. Pancytopenia secondary chemotherapy. 8. 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 The dysuria has resolved. 9. Rash and skin breakdown at the perineum/groin-she will use a topical barrier cream 10. Dehydration 04/07/2013. She received intravenous fluids. 11. Nausea/vomiting and diarrhea following cycle 2 5-FU and mitomycin C. 12. Allergic reaction to ciprofloxacin 04/07/2012 with "mouth tingling and swelling". Evaluated in the emergency Department. 13. Diarrhea not responsive to Imodium status post evaluation in the emergency department 04/09/2013. Improved. 14. Thrombocytopenia. Progressive. 15. Bilateral lower extremity edema likely secondary to hypoalbuminemia and recent intravenous hydration. 16. Hypokalemia (3.3) on labs from 04/09/2013. She reports receiving oral potassium in the emergency Department. We will check a basic metabolic panel when she returns on 04/15/2013.   Dispositon-she appears stable. She has progressive thrombocytopenia. We will have her return for a repeat CBC on 04/15/2013. She understands to call the office with any bleeding.  The leg swelling is likely secondary to hypoalbuminemia and the intravenous hydration she has received recently. She will resume hydrochlorothiazide 12.5 mg daily and work on her nutritional status. She understands to contact the office with progressive swelling or pain.  She continues to have skin breakdown at the perineum. She will continue Ultram and naproxen as needed. She also has hydrocodone.  We will see her in  followup on 04/29/2013. She will contact the office in the interim as outlined above or with any other problems.   Plan reviewed with Dr. Benay Spice.   [name] ANP/GNP-BC

## 2013-04-13 NOTE — Telephone Encounter (Signed)
gv adn printed appt sched and avs for pt for Jan and Feb... °

## 2013-04-15 ENCOUNTER — Other Ambulatory Visit (HOSPITAL_BASED_OUTPATIENT_CLINIC_OR_DEPARTMENT_OTHER): Payer: BC Managed Care – PPO

## 2013-04-15 ENCOUNTER — Telehealth: Payer: Self-pay | Admitting: Dietician

## 2013-04-15 DIAGNOSIS — C20 Malignant neoplasm of rectum: Secondary | ICD-10-CM

## 2013-04-15 DIAGNOSIS — C211 Malignant neoplasm of anal canal: Secondary | ICD-10-CM

## 2013-04-15 LAB — CBC WITH DIFFERENTIAL/PLATELET
BASO%: 0.5 % (ref 0.0–2.0)
BASOS ABS: 0 10*3/uL (ref 0.0–0.1)
EOS%: 0.6 % (ref 0.0–7.0)
Eosinophils Absolute: 0 10*3/uL (ref 0.0–0.5)
HEMATOCRIT: 22.8 % — AB (ref 34.8–46.6)
HEMOGLOBIN: 7.6 g/dL — AB (ref 11.6–15.9)
LYMPH#: 0.2 10*3/uL — AB (ref 0.9–3.3)
LYMPH%: 16.1 % (ref 14.0–49.7)
MCH: 30.8 pg (ref 25.1–34.0)
MCHC: 33.6 g/dL (ref 31.5–36.0)
MCV: 91.7 fL (ref 79.5–101.0)
MONO#: 0.1 10*3/uL (ref 0.1–0.9)
MONO%: 7.3 % (ref 0.0–14.0)
NEUT#: 1.1 10*3/uL — ABNORMAL LOW (ref 1.5–6.5)
NEUT%: 75.5 % (ref 38.4–76.8)
Platelets: 43 10*3/uL — ABNORMAL LOW (ref 145–400)
RBC: 2.48 10*6/uL — ABNORMAL LOW (ref 3.70–5.45)
RDW: 15.5 % — ABNORMAL HIGH (ref 11.2–14.5)
WBC: 1.5 10*3/uL — AB (ref 3.9–10.3)

## 2013-04-15 LAB — BASIC METABOLIC PANEL (CC13)
Anion Gap: 11 mEq/L (ref 3–11)
BUN: 6.9 mg/dL — AB (ref 7.0–26.0)
CHLORIDE: 100 meq/L (ref 98–109)
CO2: 27 meq/L (ref 22–29)
Calcium: 8.3 mg/dL — ABNORMAL LOW (ref 8.4–10.4)
Creatinine: 0.6 mg/dL (ref 0.6–1.1)
Glucose: 88 mg/dl (ref 70–140)
Potassium: 3.2 mEq/L — ABNORMAL LOW (ref 3.5–5.1)
Sodium: 137 mEq/L (ref 136–145)

## 2013-04-15 NOTE — Telephone Encounter (Signed)
Brief Outpatient Oncology Nutrition Note  Patient has been identified to be at risk on malnutrition screen.  Wt Readings from Last 10 Encounters:  04/13/13 124 lb 3.2 oz (56.337 kg)  04/07/13 121 lb 3.2 oz (54.976 kg)  04/01/13 125 lb 3.2 oz (56.79 kg)  03/28/13 124 lb 14.4 oz (56.654 kg)  03/25/13 127 lb 6.4 oz (57.788 kg)  03/18/13 127 lb 8 oz (57.834 kg)  03/11/13 130 lb 4.8 oz (59.104 kg)  03/04/13 128 lb 4.8 oz (58.196 kg)  03/02/13 128 lb (58.06 kg)  02/25/13 131 lb 11.2 oz (59.739 kg)    Dx:  Anal cancer.  Noted diarrhea per chart.  Called patient due to weight loss and diarrhea.  Patient was not available.  Message left with [place] RD's name and contact information.  Antonieta Iba, RD, LDN

## 2013-04-18 ENCOUNTER — Telehealth: Payer: Self-pay | Admitting: *Deleted

## 2013-04-18 DIAGNOSIS — E876 Hypokalemia: Secondary | ICD-10-CM

## 2013-04-18 MED ORDER — POTASSIUM CHLORIDE CRYS ER 20 MEQ PO TBCR: 20.0000 meq | EXTENDED_RELEASE_TABLET | Freq: Every day | ORAL | Status: DC

## 2013-04-18 NOTE — Telephone Encounter (Signed)
Message copied by Norma Fredrickson on Mon Apr 18, 2013  2:07 PM ------      Message from: [place], [place]      Created: Mon Apr 18, 2013  1:47 PM       Please have her start Kdur 20 meq daily. She may need a prescription. Thanks.      ----- Message -----         From: [facility]         Sent: 04/15/2013  12:38 PM           To: Owens Shark, NP                   ------

## 2013-04-18 NOTE — Telephone Encounter (Signed)
Called patient with lab results and informed her a prescription for potassium will be sent to her pharmacy.  Per Elby Showers. Marcello Moores, NP.  Patient verbalized understanding.

## 2013-04-20 ENCOUNTER — Telehealth: Payer: Self-pay | Admitting: Oncology

## 2013-04-20 ENCOUNTER — Other Ambulatory Visit: Payer: Self-pay | Admitting: Nurse Practitioner

## 2013-04-20 DIAGNOSIS — C211 Malignant neoplasm of anal canal: Secondary | ICD-10-CM

## 2013-04-20 NOTE — Telephone Encounter (Signed)
s/w pt re lb appt for 2/6 .other appts remain the same.

## 2013-04-22 ENCOUNTER — Other Ambulatory Visit (HOSPITAL_BASED_OUTPATIENT_CLINIC_OR_DEPARTMENT_OTHER): Payer: BC Managed Care – PPO

## 2013-04-22 ENCOUNTER — Other Ambulatory Visit: Payer: Self-pay | Admitting: *Deleted

## 2013-04-22 DIAGNOSIS — D61818 Other pancytopenia: Secondary | ICD-10-CM

## 2013-04-22 DIAGNOSIS — C20 Malignant neoplasm of rectum: Secondary | ICD-10-CM

## 2013-04-22 DIAGNOSIS — C211 Malignant neoplasm of anal canal: Secondary | ICD-10-CM

## 2013-04-22 DIAGNOSIS — D696 Thrombocytopenia, unspecified: Secondary | ICD-10-CM

## 2013-04-22 LAB — CBC WITH DIFFERENTIAL/PLATELET
BASO%: 0.6 % (ref 0.0–2.0)
Basophils Absolute: 0 10*3/uL (ref 0.0–0.1)
EOS ABS: 0 10*3/uL (ref 0.0–0.5)
EOS%: 0.2 % (ref 0.0–7.0)
HCT: 26.1 % — ABNORMAL LOW (ref 34.8–46.6)
HGB: 8.7 g/dL — ABNORMAL LOW (ref 11.6–15.9)
LYMPH%: 22.4 % (ref 14.0–49.7)
MCH: 31 pg (ref 25.1–34.0)
MCHC: 33.4 g/dL (ref 31.5–36.0)
MCV: 92.7 fL (ref 79.5–101.0)
MONO#: 0.5 10*3/uL (ref 0.1–0.9)
MONO%: 12.8 % (ref 0.0–14.0)
NEUT%: 64 % (ref 38.4–76.8)
NEUTROS ABS: 2.8 10*3/uL (ref 1.5–6.5)
PLATELETS: 163 10*3/uL (ref 145–400)
RBC: 2.81 10*6/uL — AB (ref 3.70–5.45)
RDW: 16 % — ABNORMAL HIGH (ref 11.2–14.5)
WBC: 4.3 10*3/uL (ref 3.9–10.3)
lymph#: 1 10*3/uL (ref 0.9–3.3)

## 2013-04-22 LAB — HOLD TUBE, BLOOD BANK

## 2013-04-22 NOTE — Telephone Encounter (Signed)
MD reviewed counts. Improved-no transfusion needed. Follow up on 2/13 as scheduled. Cancelled lab on 2/9.

## 2013-04-25 ENCOUNTER — Encounter: Payer: BC Managed Care – PPO | Admitting: Genetic Counselor

## 2013-04-25 ENCOUNTER — Other Ambulatory Visit: Payer: BC Managed Care – PPO

## 2013-04-29 ENCOUNTER — Ambulatory Visit (HOSPITAL_BASED_OUTPATIENT_CLINIC_OR_DEPARTMENT_OTHER): Payer: BC Managed Care – PPO | Admitting: Oncology

## 2013-04-29 ENCOUNTER — Other Ambulatory Visit (HOSPITAL_BASED_OUTPATIENT_CLINIC_OR_DEPARTMENT_OTHER): Payer: BC Managed Care – PPO

## 2013-04-29 VITALS — BP 120/59 | HR 89 | Temp 97.2°F | Resp 19 | Ht 63.0 in | Wt 113.5 lb

## 2013-04-29 DIAGNOSIS — C211 Malignant neoplasm of anal canal: Secondary | ICD-10-CM

## 2013-04-29 DIAGNOSIS — C20 Malignant neoplasm of rectum: Secondary | ICD-10-CM

## 2013-04-29 DIAGNOSIS — D61818 Other pancytopenia: Secondary | ICD-10-CM

## 2013-04-29 LAB — CBC WITH DIFFERENTIAL/PLATELET
BASO%: 1.1 % (ref 0.0–2.0)
Basophils Absolute: 0 10*3/uL (ref 0.0–0.1)
EOS%: 0.9 % (ref 0.0–7.0)
Eosinophils Absolute: 0 10*3/uL (ref 0.0–0.5)
HEMATOCRIT: 27.3 % — AB (ref 34.8–46.6)
HGB: 8.9 g/dL — ABNORMAL LOW (ref 11.6–15.9)
LYMPH%: 22.6 % (ref 14.0–49.7)
MCH: 31 pg (ref 25.1–34.0)
MCHC: 32.7 g/dL (ref 31.5–36.0)
MCV: 94.8 fL (ref 79.5–101.0)
MONO#: 0.6 10*3/uL (ref 0.1–0.9)
MONO%: 14.1 % — AB (ref 0.0–14.0)
NEUT#: 2.7 10*3/uL (ref 1.5–6.5)
NEUT%: 61.3 % (ref 38.4–76.8)
Platelets: 352 10*3/uL (ref 145–400)
RBC: 2.88 10*6/uL — ABNORMAL LOW (ref 3.70–5.45)
RDW: 18.7 % — ABNORMAL HIGH (ref 11.2–14.5)
WBC: 4.4 10*3/uL (ref 3.9–10.3)
lymph#: 1 10*3/uL (ref 0.9–3.3)

## 2013-04-29 LAB — BASIC METABOLIC PANEL (CC13)
ANION GAP: 8 meq/L (ref 3–11)
BUN: 7.7 mg/dL (ref 7.0–26.0)
CO2: 26 mEq/L (ref 22–29)
CREATININE: 0.8 mg/dL (ref 0.6–1.1)
Calcium: 9.2 mg/dL (ref 8.4–10.4)
Chloride: 104 mEq/L (ref 98–109)
Glucose: 84 mg/dl (ref 70–140)
Potassium: 3.7 mEq/L (ref 3.5–5.1)
Sodium: 139 mEq/L (ref 136–145)

## 2013-04-29 NOTE — Progress Notes (Signed)
   [place]    OFFICE PROGRESS NOTE   INTERVAL HISTORY:   Ms. [name] returns for scheduled followup of anal cancer. She reports feeling much better. Her appetite and energy level have improved. No diarrhea. The skin breakdown has healed. The leg edema has resolved.  Objective:  Vital signs in last 24 hours:  Blood pressure 120/59, pulse 89, temperature 97.2 F (36.2 C), temperature source Oral, resp. rate 19, height 5\' 3"  (1.6 m), weight 113 lb 8 oz (51.483 kg), SpO2 100.00%.    HEENT: No thrush or ulcers Lymphatics: No inguinal nodes Resp: Lungs clear bilaterally Cardio: Regular rate and rhythm GI: No hepatomegaly, nontender, no mass Vascular: No leg edema  Skin: Radiation hyperpigmentation at the groin and perineum without skin breakdown.    Lab Results:  Lab Results  Component Value Date   WBC 4.4 04/29/2013   HGB 8.9* 04/29/2013   HCT 27.3* 04/29/2013   MCV 94.8 04/29/2013   PLT 352 04/29/2013   NEUTROABS 2.7 04/29/2013   Potassium 3.7, creatinine 0.8   Medications: I have reviewed the patient's current medications.  Assessment/Plan: 1. 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  Staging CT of the abdomen/pelvis 01/24/2013 revealed small perirectal lymph nodes and a 6 mm left internal iliac node.  Radiation and cycle 1 5-FU/mitomycin C. initiated on 02/21/2013.  Cycle 2 5-FU/mitomycin-C 03/28/2013, radiation completed 04/04/2013. 2. Pericarditis 2006. 3. History of thrombocytopenia. Question ITP. 4. Status post TAH/BSO. 5. Family history multiple cancers. 6. History of mucositis secondary to chemotherapy. 7. Pancytopenia secondary chemotherapy. Improved. 8. 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 The dysuria has resolved. 9. Rash and skin breakdown at the perineum/groin-resolved. 10. Dehydration 04/07/2013. She received intravenous fluids. 11. Nausea/vomiting and diarrhea following cycle 2 5-FU and  mitomycin C. 12. Allergic reaction to ciprofloxacin 04/07/2012 with "mouth tingling and swelling". Evaluated in the emergency Department.   Disposition:  The diarrhea has resolved and the breakdown at the perineum has healed. The pancytopenia has improved. I suspect the weight loss over the past 2 weeks is related to resolution of leg edema. She reports a good appetite at present.  [name] will return for an office visit in 4-6 weeks. We will refer her to Dr. Watt Climes in a few months for a repeat endoscopic examination.  Betsy Coder, MD  04/29/2013  4:23 PM

## 2013-05-04 ENCOUNTER — Telehealth: Payer: Self-pay | Admitting: Oncology

## 2013-05-04 NOTE — Telephone Encounter (Signed)
, °

## 2013-05-05 ENCOUNTER — Encounter: Payer: BC Managed Care – PPO | Admitting: Genetic Counselor

## 2013-05-05 ENCOUNTER — Other Ambulatory Visit: Payer: BC Managed Care – PPO

## 2013-05-18 ENCOUNTER — Other Ambulatory Visit: Payer: Self-pay | Admitting: Obstetrics and Gynecology

## 2013-05-19 ENCOUNTER — Ambulatory Visit: Payer: Self-pay | Admitting: Radiation Oncology

## 2013-05-30 ENCOUNTER — Encounter: Payer: Self-pay | Admitting: Radiation Oncology

## 2013-06-02 ENCOUNTER — Telehealth: Payer: Self-pay | Admitting: Oncology

## 2013-06-02 ENCOUNTER — Ambulatory Visit
Admission: RE | Admit: 2013-06-02 | Discharge: 2013-06-02 | Disposition: A | Discharge status: Home / Self Care | Payer: BC Managed Care – PPO | Source: Ambulatory Visit | Attending: Radiation Oncology | Admitting: Radiation Oncology

## 2013-06-02 ENCOUNTER — Encounter: Payer: Self-pay | Admitting: Radiation Oncology

## 2013-06-02 ENCOUNTER — Ambulatory Visit (HOSPITAL_BASED_OUTPATIENT_CLINIC_OR_DEPARTMENT_OTHER): Payer: BC Managed Care – PPO | Admitting: Nurse Practitioner

## 2013-06-02 ENCOUNTER — Other Ambulatory Visit (HOSPITAL_BASED_OUTPATIENT_CLINIC_OR_DEPARTMENT_OTHER): Payer: BC Managed Care – PPO

## 2013-06-02 VITALS — BP 132/68 | HR 90 | Temp 97.6°F | Resp 18 | Ht 63.0 in | Wt 121.0 lb

## 2013-06-02 VITALS — BP 99/61 | HR 88 | Temp 98.2°F | Resp 20 | Wt 122.2 lb

## 2013-06-02 DIAGNOSIS — K649 Unspecified hemorrhoids: Secondary | ICD-10-CM

## 2013-06-02 DIAGNOSIS — C211 Malignant neoplasm of anal canal: Secondary | ICD-10-CM

## 2013-06-02 DIAGNOSIS — Z809 Family history of malignant neoplasm, unspecified: Secondary | ICD-10-CM

## 2013-06-02 DIAGNOSIS — D61810 Antineoplastic chemotherapy induced pancytopenia: Secondary | ICD-10-CM

## 2013-06-02 DIAGNOSIS — T451X5A Adverse effect of antineoplastic and immunosuppressive drugs, initial encounter: Secondary | ICD-10-CM

## 2013-06-02 DIAGNOSIS — R112 Nausea with vomiting, unspecified: Secondary | ICD-10-CM

## 2013-06-02 HISTORY — DX: Personal history of irradiation: Z92.3

## 2013-06-02 LAB — CBC WITH DIFFERENTIAL/PLATELET
BASO%: 0.6 % (ref 0.0–2.0)
Basophils Absolute: 0 10*3/uL (ref 0.0–0.1)
EOS ABS: 0.1 10*3/uL (ref 0.0–0.5)
EOS%: 2.3 % (ref 0.0–7.0)
HCT: 31.9 % — ABNORMAL LOW (ref 34.8–46.6)
HGB: 10.4 g/dL — ABNORMAL LOW (ref 11.6–15.9)
LYMPH%: 20.3 % (ref 14.0–49.7)
MCH: 32 pg (ref 25.1–34.0)
MCHC: 32.7 g/dL (ref 31.5–36.0)
MCV: 98 fL (ref 79.5–101.0)
MONO#: 0.6 10*3/uL (ref 0.1–0.9)
MONO%: 12 % (ref 0.0–14.0)
NEUT%: 64.8 % (ref 38.4–76.8)
NEUTROS ABS: 3.2 10*3/uL (ref 1.5–6.5)
Platelets: 220 10*3/uL (ref 145–400)
RBC: 3.26 10*6/uL — AB (ref 3.70–5.45)
RDW: 16.6 % — AB (ref 11.2–14.5)
WBC: 4.9 10*3/uL (ref 3.9–10.3)
lymph#: 1 10*3/uL (ref 0.9–3.3)
nRBC: 0 % (ref 0–0)

## 2013-06-02 NOTE — Progress Notes (Signed)
Radiation Oncology         (336) [phone] ________________________________  Name: [name] MRN: [id number]  Date: 06/02/2013  DOB: 10/27/1950  Follow-Up Visit Note  CC: Rubbie Battiest, MD  Magod, Caryl Bis, MD  Diagnosis:   Carcinoma of the anal canal  Interval Since Last Radiation:   6 weeks   Narrative:  The patient returns today for routine follow-up.  The patient states that she is doing well. She was seen earlier today by Dr. Benay Spice in medical oncology. She feels that her skin has healed very well and has no complaints in this regard today. She does have a hemorrhoid which was seen on exam today in medical oncology which led to a recommendation that she will begin using a stool softener. She has good appetite and her energy level has been improving. Overall she is very pleased with how she is done over the last month.                              ALLERGIES:  is allergic to ciprofloxacin and penicillins.  Meds: Current Outpatient Prescriptions  Medication Sig Dispense Refill  . Cholecalciferol 10000 UNITS CAPS Take 1,000 Units by mouth daily.      . Cyanocobalamin 1500 MCG TBDP Take 1,500 mcg by mouth daily.      Marland Kitchen estradiol (ESTRACE) 1 MG tablet       . estrogens, conjugated, (PREMARIN) 0.625 MG tablet Take 0.625 mg by mouth daily.       . hydrochlorothiazide (MICROZIDE) 12.5 MG capsule Take 12.5 mg by mouth daily.      Marland Kitchen HYDROcodone-acetaminophen (NORCO/VICODIN) 5-325 MG per tablet Take 1 tablet by mouth every 6 (six) hours as needed for moderate pain.  50 tablet  0  . loperamide (IMODIUM) 2 MG capsule Take 2 mg by mouth as needed for diarrhea or loose stools.      . naproxen sodium (ANAPROX) 220 MG tablet Take 220 mg by mouth 2 (two) times daily as needed (pain).       . Omega-3 Fatty Acids (FISH OIL BURP-LESS PO) Take 1 tablet by mouth daily.        . prochlorperazine (COMPAZINE) 10 MG tablet Take 1 tablet (10 mg total) by mouth every 6 (six) hours as needed for nausea.  60  tablet  1  . traMADol (ULTRAM) 50 MG tablet Take 1 tablet (50 mg total) by mouth every 8 (eight) hours as needed. For pain  50 tablet  0   No current facility-administered medications for this encounter.    Physical Findings: The patient is in no acute distress. Patient is alert and oriented.  weight is 122 lb 3.2 oz (55.43 kg). Her oral temperature is 98.2 F (36.8 C). Her blood pressure is 99/61 and her pulse is 88. Her respiration is 20. .   The patient deferred exam today since she was seen by medical oncology.  Lab Findings: Lab Results  Component Value Date   WBC 4.9 06/02/2013   HGB 10.4* 06/02/2013   HCT 31.9* 06/02/2013   MCV 98.0 06/02/2013   PLT 220 06/02/2013     Radiographic Findings: No results found.  Impression:    The patient is doing very well and has recovered satisfactorily from her course of treatment. She will be seen by Dr. Watt Climes for evaluation within the next 4-6 weeks and will see Dr. Benay Spice in approximately 6 months. I discussed with her  that anal cancer can continue to shrink/regress for many months after treatment. We discussed that there is not a need for aggressive evaluation such as a biopsy if things are improving clinically and visually. With worsening symptoms or an enlarging mass however then further evaluation such as a biopsy may be necessary. In general however the hurdle for this is somewhat high given the lengthy timeframe of regression often seen with radiation treatment. The patient had a number of questions about this and we had a good discussion about this issue esophagitis.  Plan:  The patient will followup in 3 months.   Jodelle Gross, M.D., Ph.D.

## 2013-06-02 NOTE — Progress Notes (Signed)
OFFICE PROGRESS NOTE  Interval history:  [name] returns for followup of anal cancer. She overall is feeling well. She has a good appetite and is gaining weight. Energy level is improving. About 3 weeks ago, after straining for a bowel movement, she began experiencing pain with bowel movements. She wonders if she has a hemorrhoid. She occasionally notes bright red blood on the toilet tissue after a bowel movement.   Objective: Filed Vitals:   06/02/13 1339  BP: 132/68  Pulse: 90  Temp: 97.6 F (36.4 C)  Resp: 18   Oropharynx is without thrush or ulceration. No palpable cervical, supraclavicular, axillary or inguinal lymph nodes. Lungs are clear. No wheezes or rales. Regular cardiac rhythm. Abdomen is soft and nontender. No organomegaly. No mass. No leg edema. At the anterior anal verge there is an approximate 1 cm rounded hemorrhoid; no evidence of recurrent cancer; the anus is stenotic. Complete rectal exam could not be performed. Radiation skin changes at the perineum. No skin breakdown.   Lab Results: Lab Results  Component Value Date   WBC 4.9 06/02/2013   HGB 10.4* 06/02/2013   HCT 31.9* 06/02/2013   MCV 98.0 06/02/2013   PLT 220 06/02/2013   NEUTROABS 3.2 06/02/2013    Chemistry:    Chemistry      Component Value Date/Time   NA 139 04/29/2013 1158   NA 137 04/09/2013 1653   K 3.7 04/29/2013 1158   K 3.3* 04/09/2013 1653   CL 102 04/09/2013 1653   CO2 26 04/29/2013 1158   CO2 24 04/09/2013 1653   BUN 7.7 04/29/2013 1158   BUN 12 04/09/2013 1653   CREATININE 0.8 04/29/2013 1158   CREATININE 0.65 04/09/2013 1653      Component Value Date/Time   CALCIUM 9.2 04/29/2013 1158   CALCIUM 8.0* 04/09/2013 1653   ALKPHOS 50 04/09/2013 1653   ALKPHOS 44 03/28/2013 1109   AST 13 04/09/2013 1653   AST 21 03/28/2013 1109   ALT 8 04/09/2013 1653   ALT 15 03/28/2013 1109   BILITOT 0.5 04/09/2013 1653   BILITOT 0.62 03/28/2013 1109       Studies/Results: No results found.  Medications: I  have reviewed the patient's current medications.  Assessment/Plan: 1. 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  Staging CT of the abdomen/pelvis 01/24/2013 revealed small perirectal lymph nodes and a 6 mm left internal iliac node.  Radiation and cycle 1 5-FU/mitomycin C. initiated on 02/21/2013.  Cycle 2 5-FU/mitomycin-C 03/28/2013, radiation completed 04/04/2013. 2. Pericarditis 2006. 3. History of thrombocytopenia. Question ITP. 4. Status post TAH/BSO. 5. Family history multiple cancers. 6. History of mucositis secondary to chemotherapy. 7. Pancytopenia secondary chemotherapy. Improved. 8. 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 The dysuria has resolved. 9. Rash and skin breakdown at the perineum/groin-resolved. 10. Dehydration 04/07/2013. She received intravenous fluids. 11. Nausea/vomiting and diarrhea following cycle 2 5-FU and mitomycin C. 12. Allergic reaction to ciprofloxacin 04/07/2012 with "mouth tingling and swelling". Evaluated in the emergency Department. 37. Hemorrhoid at the anterior anal verge 06/02/2013.   Dispositon-she appears stable. She is in clinical remission from anal cancer. Dr. Benay Spice recommends a referral to Dr. Watt Climes for a followup flexible sigmoidoscopy in 4-6 weeks.  She has a hemorrhoid at the anterior anal verge and is having discomfort with bowel movements. She will begin a stool softener. If the discomfort persists she will discuss with Dr. Watt Climes.  She will return for a followup visit here in 4-6 months. She  will contact the office in the interim with any problems.  Patient seen with Dr. Benay Spice. 25 minutes were spent face-to-face at today's visit with the majority of that time involved in counseling/coordination of care.     [name] ANP/GNP-BC   This was a shared visit with [name]. [name] was interviewed and examined.the lesion at the anterior anal verge appears to be a hemorrhoid. The anal  canal is stenotic. We will refer her to Dr. Watt Climes for a repeat endoscopic evaluation.  [place].

## 2013-06-02 NOTE — Progress Notes (Signed)
Follow anal ca s/p rad txs 02/21/13-04/04/13, says no pain, occasional blood on tissue whn wiping her bottom but that is from a hemorrhoid, reg bm, no nausea, appetite good, gaining weight, energy level is some better,  4:28 PM

## 2013-06-02 NOTE — Telephone Encounter (Signed)
Gave pt appt for lab and MD on July 2015 °

## 2013-06-10 ENCOUNTER — Telehealth: Payer: Self-pay | Admitting: *Deleted

## 2013-06-10 NOTE — Telephone Encounter (Signed)
Message from pt calling to follow up on referral to Dr. Watt Climes for repeat colonoscopy. Called Magod's office, they have received referral. In review for appt. Made pt aware.

## 2013-07-05 ENCOUNTER — Telehealth: Payer: Self-pay | Admitting: *Deleted

## 2013-07-05 NOTE — Telephone Encounter (Addendum)
Pt returned call, reports legs feel as though she completed a tough work out. Reports she walks for exercise. Soreness has been on going on about 3-4 weeks. Reviewed with Dr. Benay Spice: likely not related to cancer or chemo. May be due to becoming more active after completing treatment. Pt voiced understanding, stated she will follow up with PCP if this persists.

## 2013-07-05 NOTE — Telephone Encounter (Signed)
Message from pt reporting "achy, sore, tired legs" for about 3 weeks. Asking if this is to be expected post treatment?  Returned call, left message requesting she call back.

## 2013-08-25 ENCOUNTER — Ambulatory Visit: Payer: BC Managed Care – PPO | Admitting: Radiation Oncology

## 2013-08-26 ENCOUNTER — Ambulatory Visit
Admission: RE | Admit: 2013-08-26 | Discharge: 2013-08-26 | Disposition: A | Discharge status: Home / Self Care | Payer: BC Managed Care – PPO | Source: Ambulatory Visit | Attending: Radiation Oncology | Admitting: Radiation Oncology

## 2013-08-26 ENCOUNTER — Encounter: Payer: Self-pay | Admitting: Radiation Oncology

## 2013-08-26 VITALS — BP 142/80 | HR 86 | Temp 97.8°F | Resp 20 | Wt 114.4 lb

## 2013-08-26 DIAGNOSIS — C211 Malignant neoplasm of anal canal: Secondary | ICD-10-CM

## 2013-08-26 NOTE — Progress Notes (Signed)
Follow up anal rad txs 02/21/13-04/04/13, saw Dr. Watt Climes last Wednesday, had colonoscopy,patient said "no cancer" report not in Epic, no pain, no nausea, no difficulty with bowel movements, energy level great, great appetite 10:14 AM

## 2013-08-27 ENCOUNTER — Telehealth: Payer: Self-pay | Admitting: Oncology

## 2013-08-27 NOTE — Telephone Encounter (Signed)
[facility] - MOVED 7/24 APPT TO 7/22. [facility]MONVM FOR PT AND MAILED SCHEDULE.

## 2013-08-28 NOTE — Progress Notes (Addendum)
  Radiation Oncology         [phone]) [phone] ________________________________  Name: [name] MRN: [id number]  Date: 08/26/2013  DOB: 05-Aug-1950  Follow-Up Visit Note  CC: Rubbie Battiest, MD  Watt Climes Caryl Bis, MD  Diagnosis:   Squamous cell carcinoma of the proximal anal canal  Interval Since Last Radiation:  The patient completed radiation treatment on 04/04/2013   Narrative:  The patient returns today for routine follow-up.  The patient states she is doing very well. She has no complaints of discomfort currently in the anal region. No pain with bowel movements and she feels that things along these lines are essentially back to normal. No ongoing diarrhea. The patient recently was seen by GI and did not have any concerning findings. She is very pleased with this.                              ALLERGIES:  is allergic to ciprofloxacin and penicillins.  Meds: Current Outpatient Prescriptions  Medication Sig Dispense Refill  . Cholecalciferol 10000 UNITS CAPS Take 1,000 Units by mouth daily.      . Cyanocobalamin 1500 MCG TBDP Take 1,500 mcg by mouth daily.      Marland Kitchen estradiol (ESTRACE) 1 MG tablet       . estrogens, conjugated, (PREMARIN) 0.625 MG tablet Take 0.625 mg by mouth daily.       . hydrochlorothiazide (MICROZIDE) 12.5 MG capsule Take 12.5 mg by mouth daily.      . Omega-3 Fatty Acids (FISH OIL BURP-LESS PO) Take 1 tablet by mouth daily.        Marland Kitchen loperamide (IMODIUM) 2 MG capsule Take 2 mg by mouth as needed for diarrhea or loose stools.      . naproxen sodium (ANAPROX) 220 MG tablet Take 220 mg by mouth 2 (two) times daily as needed (pain).       . prochlorperazine (COMPAZINE) 10 MG tablet Take 1 tablet (10 mg total) by mouth every 6 (six) hours as needed for nausea.  60 tablet  1  . traMADol (ULTRAM) 50 MG tablet Take 1 tablet (50 mg total) by mouth every 8 (eight) hours as needed. For pain  50 tablet  0   No current facility-administered medications for this encounter.    Physical  Findings: The patient is in no acute distress. Patient is alert and oriented.  weight is 114 lb 6.4 oz (51.891 kg). Her oral temperature is 97.8 F (36.6 C). Her blood pressure is 142/80 and her pulse is 86. Her respiration is 20. .   On anal exam, some hyperpigmentation/radiation effect present. Some scarring present without any concerning findings regarding persistent disease.  Lab Findings: Lab Results  Component Value Date   WBC 4.9 06/02/2013   HGB 10.4* 06/02/2013   HCT 31.9* 06/02/2013   MCV 98.0 06/02/2013   PLT 220 06/02/2013     Radiographic Findings: No results found.  Impression:    The patient is doing well and is clinically NED at this time. She recently had a good exam with GI medicine and we will continue routine followup.  Plan:  The patient will return to our clinic in 6 months.  I spent 15 minutes with the patient today, the majority of which was spent counseling the patient on the diagnosis of cancer and coordinating care.   Jodelle Gross, M.D., Ph.D.

## 2013-10-05 ENCOUNTER — Telehealth: Payer: Self-pay | Admitting: Oncology

## 2013-10-05 ENCOUNTER — Ambulatory Visit (HOSPITAL_BASED_OUTPATIENT_CLINIC_OR_DEPARTMENT_OTHER): Payer: BC Managed Care – PPO | Admitting: Oncology

## 2013-10-05 ENCOUNTER — Other Ambulatory Visit (HOSPITAL_BASED_OUTPATIENT_CLINIC_OR_DEPARTMENT_OTHER): Payer: BC Managed Care – PPO

## 2013-10-05 VITALS — BP 128/66 | HR 71 | Temp 97.9°F | Resp 18 | Ht 63.0 in | Wt 112.4 lb

## 2013-10-05 DIAGNOSIS — C211 Malignant neoplasm of anal canal: Secondary | ICD-10-CM

## 2013-10-05 DIAGNOSIS — K648 Other hemorrhoids: Secondary | ICD-10-CM

## 2013-10-05 DIAGNOSIS — C20 Malignant neoplasm of rectum: Secondary | ICD-10-CM

## 2013-10-05 DIAGNOSIS — K644 Residual hemorrhoidal skin tags: Secondary | ICD-10-CM

## 2013-10-05 DIAGNOSIS — Z809 Family history of malignant neoplasm, unspecified: Secondary | ICD-10-CM

## 2013-10-05 DIAGNOSIS — R112 Nausea with vomiting, unspecified: Secondary | ICD-10-CM

## 2013-10-05 DIAGNOSIS — R197 Diarrhea, unspecified: Secondary | ICD-10-CM

## 2013-10-05 LAB — CBC WITH DIFFERENTIAL/PLATELET
BASO%: 0.5 % (ref 0.0–2.0)
BASOS ABS: 0 10*3/uL (ref 0.0–0.1)
EOS%: 4 % (ref 0.0–7.0)
Eosinophils Absolute: 0.1 10*3/uL (ref 0.0–0.5)
HEMATOCRIT: 37.3 % (ref 34.8–46.6)
HEMOGLOBIN: 12.2 g/dL (ref 11.6–15.9)
LYMPH%: 23.2 % (ref 14.0–49.7)
MCH: 29.8 pg (ref 25.1–34.0)
MCHC: 32.7 g/dL (ref 31.5–36.0)
MCV: 91.2 fL (ref 79.5–101.0)
MONO#: 0.4 10*3/uL (ref 0.1–0.9)
MONO%: 13.4 % (ref 0.0–14.0)
NEUT#: 1.8 10*3/uL (ref 1.5–6.5)
NEUT%: 58.9 % (ref 38.4–76.8)
Platelets: 197 10*3/uL (ref 145–400)
RBC: 4.09 10*6/uL (ref 3.70–5.45)
RDW: 15.9 % — ABNORMAL HIGH (ref 11.2–14.5)
WBC: 3 10*3/uL — AB (ref 3.9–10.3)
lymph#: 0.7 10*3/uL — ABNORMAL LOW (ref 0.9–3.3)

## 2013-10-05 NOTE — Telephone Encounter (Signed)
gv and printed appt scehd and avs for pt for Jan 2016

## 2013-10-05 NOTE — Progress Notes (Signed)
  [name] OFFICE PROGRESS NOTE   Diagnosis: Anal cancer  INTERVAL HISTORY:   She returns as scheduled. She feels well. Good appetite and energy level. No rectal pain. She occasionally has difficulty having a bowel movement. She was taken to a colonoscopy by Dr. Watt Climes 08/17/2013. Internal and external hemorrhoids were noted. Scarred mucosa was found in the rectum.   Objective:  Vital signs in last 24 hours:  Blood pressure 128/66, pulse 71, temperature 97.9 F (36.6 C), temperature source Oral, resp. rate 18, height 5\' 3"  (1.6 m), weight 112 lb 6.4 oz (50.984 kg), SpO2 100.00%.    HEENT: Neck without mass Lymphatics: No cervical, supraclavicular, axillary, or inguinal nodes Resp: Lungs clear bilaterally Cardio: Regular rate and rhythm GI: No hepatosplenomegaly, nontender, no mass. Rectal exam deferred secondary to recent examination by Dr. Lisbeth Renshaw and colonoscopy Vascular: No leg edema   Lab Results:  Lab Results  Component Value Date   WBC 3.0* 10/05/2013   HGB 12.2 10/05/2013   HCT 37.3 10/05/2013   MCV 91.2 10/05/2013   PLT 197 10/05/2013   NEUTROABS 1.8 10/05/2013    Medications: I have reviewed the patient's current medications.  Assessment/Plan: 1. 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  Staging CT of the abdomen/pelvis 01/24/2013 revealed small perirectal lymph nodes and a 6 mm left internal iliac node.  Radiation and cycle 1 5-FU/mitomycin C. initiated on 02/21/2013.  Cycle 2 5-FU/mitomycin-C 03/28/2013, radiation completed 04/04/2013. 2. Pericarditis 2006. 3. History of thrombocytopenia. Question ITP. 4. Status post TAH/BSO. 5. Family history multiple cancers. 6. History of mucositis secondary to chemotherapy. 7. History of Pancytopenia secondary chemotherapy.  8. 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 The dysuria has resolved. 9. Rash and skin breakdown at the perineum/groin-resolved. 10. Dehydration  04/07/2013. She received intravenous fluids. 11. Nausea/vomiting and diarrhea following cycle 2 5-FU and mitomycin C. 12. Allergic reaction to ciprofloxacin 04/07/2012 with "mouth tingling and swelling". Evaluated in the emergency Department.    Disposition:  [name] remains in clinical remission from anal cancer. She is scheduled to see Dr. Lisbeth Renshaw in October. She will return for a 6 month office visit in medical oncology.  Betsy Coder, MD  10/05/2013  10:33 AM

## 2013-10-07 ENCOUNTER — Other Ambulatory Visit: Payer: BC Managed Care – PPO

## 2013-10-07 ENCOUNTER — Ambulatory Visit: Payer: BC Managed Care – PPO | Admitting: Oncology

## 2013-10-18 DIAGNOSIS — H269 Unspecified cataract: Secondary | ICD-10-CM | POA: Insufficient documentation

## 2013-10-18 DIAGNOSIS — I10 Essential (primary) hypertension: Secondary | ICD-10-CM | POA: Insufficient documentation

## 2013-10-18 DIAGNOSIS — F419 Anxiety disorder, unspecified: Secondary | ICD-10-CM | POA: Insufficient documentation

## 2013-10-18 DIAGNOSIS — G43909 Migraine, unspecified, not intractable, without status migrainosus: Secondary | ICD-10-CM | POA: Insufficient documentation

## 2013-11-29 ENCOUNTER — Encounter: Payer: Self-pay | Admitting: Family Medicine

## 2013-11-29 ENCOUNTER — Ambulatory Visit (INDEPENDENT_AMBULATORY_CARE_PROVIDER_SITE_OTHER): Payer: BC Managed Care – PPO | Admitting: Family Medicine

## 2013-11-29 VITALS — BP 110/80 | Temp 98.2°F | Ht 63.0 in | Wt 115.0 lb

## 2013-11-29 DIAGNOSIS — M899 Disorder of bone, unspecified: Secondary | ICD-10-CM

## 2013-11-29 DIAGNOSIS — M898X1 Other specified disorders of bone, shoulder: Secondary | ICD-10-CM

## 2013-11-29 DIAGNOSIS — M949 Disorder of cartilage, unspecified: Secondary | ICD-10-CM

## 2013-11-29 MED ORDER — DICLOFENAC SODIUM 75 MG PO TBEC: 75.0000 mg | DELAYED_RELEASE_TABLET | Freq: Two times a day (BID) | ORAL | Status: DC

## 2013-11-29 MED ORDER — HYDROCODONE-ACETAMINOPHEN 5-325 MG PO TABS: ORAL_TABLET | ORAL | Status: DC

## 2013-11-29 MED ORDER — CHLORZOXAZONE 500 MG PO TABS: 500.0000 mg | ORAL_TABLET | Freq: Three times a day (TID) | ORAL | Status: DC | PRN

## 2013-11-29 NOTE — Progress Notes (Signed)
   Subjective:    Patient ID: [name], female    DOB: 1950/04/07, 63 y.o.   MRN: [id number]  Shoulder Pain  The pain is present in the right shoulder. This is a new problem. The current episode started in the past 7 days. The problem occurs intermittently. The problem has been unchanged. The quality of the pain is described as aching. The pain is at a severity of 8/10. The pain is moderate. The symptoms are aggravated by activity. She has tried NSAIDS (prednisone) for the symptoms. The treatment provided no relief.   Patient states she has no other concerns at this time.   Periscapular pain and spasm  Fairly severe  Rad on inro the arm  Upper body exercise  Frequently  Took aleave and naproxen  Pt tried pred today   Review of Systems No neck pain no significant shoulder pain no back pain no chest pain ROS otherwise negative    Objective:   Physical Exam  Alert mild malaise. Vital stable. Periscapular tenderness evident. Neck supple. Shoulder good range of motion distal strength intact    Assessment & Plan:  No neck pain no impression. Scapular strain discussed plan anti-inflammatory medicine discussed. Muscle spasm medicine. Nighttime narcotic when necessary. Local measures discussed. WSL

## 2013-12-02 ENCOUNTER — Telehealth: Payer: Self-pay | Admitting: Family Medicine

## 2013-12-02 DIAGNOSIS — C211 Malignant neoplasm of anal canal: Secondary | ICD-10-CM

## 2013-12-02 MED ORDER — TRAMADOL HCL 50 MG PO TABS: 50.0000 mg | ORAL_TABLET | Freq: Every evening | ORAL | Status: DC | PRN

## 2013-12-02 NOTE — Telephone Encounter (Signed)
HYDROcodone-acetaminophen (NORCO/VICODIN) 5-325 MG per tablet  Pt states this med is causing her to stay awake, not helping her at all  To be able to rest. She said it is helping the pain but the lack of sleep is  Not helping things at all.   Pt would like to try something else, like tramadol. This has worked for  Her in the past.   Pt seen 11/29/13  [employer]

## 2013-12-02 NOTE — Telephone Encounter (Signed)
tamadol fifty numb thirty one qhs prn

## 2013-12-02 NOTE — Telephone Encounter (Signed)
Rx faxed to pharmacy. Patient notified. 

## 2013-12-06 ENCOUNTER — Ambulatory Visit (INDEPENDENT_AMBULATORY_CARE_PROVIDER_SITE_OTHER): Payer: BC Managed Care – PPO | Admitting: Family Medicine

## 2013-12-06 ENCOUNTER — Encounter: Payer: Self-pay | Admitting: Family Medicine

## 2013-12-06 VITALS — BP 120/80 | Ht 63.0 in | Wt 115.0 lb

## 2013-12-06 DIAGNOSIS — G5691 Unspecified mononeuropathy of right upper limb: Secondary | ICD-10-CM

## 2013-12-06 DIAGNOSIS — G5690 Unspecified mononeuropathy of unspecified upper limb: Secondary | ICD-10-CM

## 2013-12-06 DIAGNOSIS — M542 Cervicalgia: Secondary | ICD-10-CM

## 2013-12-06 DIAGNOSIS — C211 Malignant neoplasm of anal canal: Secondary | ICD-10-CM

## 2013-12-06 MED ORDER — HYDROCODONE-ACETAMINOPHEN 5-325 MG PO TABS: ORAL_TABLET | ORAL | Status: DC

## 2013-12-06 MED ORDER — TRAMADOL HCL 50 MG PO TABS: 50.0000 mg | ORAL_TABLET | Freq: Every evening | ORAL | Status: DC | PRN

## 2013-12-06 MED ORDER — PREDNISONE 20 MG PO TABS: ORAL_TABLET | ORAL | Status: DC

## 2013-12-06 MED ORDER — TRAMADOL HCL 50 MG PO TABS
50.0000 mg | ORAL_TABLET | Freq: Every evening | ORAL | Status: DC | PRN
Start: 2013-12-06 — End: 2014-01-05

## 2013-12-06 NOTE — Progress Notes (Signed)
   Subjective:    Patient ID: [name], female    DOB: 1950-08-15, 63 y.o.   MRN: [id number]  HPI  Patient arrives with continued pain in right shoulder.  Pain is worse- having problems sleeping. Please see prior note.  The patient's pain and distress as worsened markedly. See prior notes now no longer just right shoulder pain. Patient is experiencing pain all the way into the right hand.  She describes numbness in an ulnar distribution. Also sense of weakness. Next  Deep aching pain and burning throughout the entire arm day and night.  Unable to sleep.  Shoulder pain is improved somewhat.  Obvious neck pain.  Some pain at the elbow to  Review of Systems No headache no loss of consciousness no chest pain no abdominal pain some hyperactivity with pain medicine    Objective:   Physical Exam Alert mild malaise. Neck supple shoulder good range of motion. Lungs clear. Heart rare rhythm. Right arm distal strength question diminished and in an older region. Diminished sensation right lateral arm and right lateral hand. Some deep pain tenderness in the elbow. Some pain with rotation of the neck. Shoulder overall good range of motion.       Assessment & Plan:  Please see prior note. Impression neuropathic pain discussed at great length. 2 most likely sources are cervical spine and right elbow discussed at length. Plan prednisone taper. Stop Lodine. Hydrocodone and Ultram when necessary. Cervical MRI further recommendations based results. Right arm nerve conduction [place] neurologist rationale discussed. WSL

## 2013-12-09 ENCOUNTER — Ambulatory Visit (HOSPITAL_COMMUNITY)
Admission: RE | Admit: 2013-12-09 | Discharge: 2013-12-09 | Disposition: A | Discharge status: Home / Self Care | Payer: BC Managed Care – PPO | Source: Ambulatory Visit | Attending: Family Medicine | Admitting: Family Medicine

## 2013-12-09 DIAGNOSIS — R209 Unspecified disturbances of skin sensation: Secondary | ICD-10-CM | POA: Insufficient documentation

## 2013-12-09 DIAGNOSIS — M5030 Other cervical disc degeneration, unspecified cervical region: Secondary | ICD-10-CM | POA: Diagnosis not present

## 2013-12-09 DIAGNOSIS — M542 Cervicalgia: Secondary | ICD-10-CM | POA: Diagnosis present

## 2013-12-09 DIAGNOSIS — M4802 Spinal stenosis, cervical region: Secondary | ICD-10-CM | POA: Diagnosis not present

## 2013-12-09 DIAGNOSIS — M25519 Pain in unspecified shoulder: Secondary | ICD-10-CM | POA: Diagnosis not present

## 2013-12-14 ENCOUNTER — Ambulatory Visit (INDEPENDENT_AMBULATORY_CARE_PROVIDER_SITE_OTHER): Payer: Self-pay

## 2013-12-14 ENCOUNTER — Ambulatory Visit (INDEPENDENT_AMBULATORY_CARE_PROVIDER_SITE_OTHER): Payer: BC Managed Care – PPO | Admitting: Neurology

## 2013-12-14 DIAGNOSIS — M542 Cervicalgia: Secondary | ICD-10-CM

## 2013-12-14 DIAGNOSIS — R209 Unspecified disturbances of skin sensation: Secondary | ICD-10-CM

## 2013-12-14 DIAGNOSIS — Z0289 Encounter for other administrative examinations: Secondary | ICD-10-CM

## 2013-12-14 NOTE — Progress Notes (Signed)
  [facility]  Provider:  Dr Jaynee Eagles Referring Provider: Mikey Kirschner, MD Primary Care Physician:  Rubbie Battiest, MD  HPI:  [name] is a 63 y.o. female here as a referral from Dr. Wolfgang Phoenix for neck pain and paresthesias. She has had over 2 weeks of right-sided neck pain with numbness of the right 5th digit. Denies radicular symptoms. Also endorses tenderness of the right triceps and right-sided weakness.  Summary:   Nerve Conduction Studies were performed on the bilateral upper extremities.  The right median motor nerve showed prolonged distal onset latency (4.7 ms, N<4.0).  The left median motor nerve showed prolonged distal onset latency (4.5 ms, N<4.0). The bilateral median F waves were within normal limits.  The right Median 2nd Digit sensory nerve showed prolonged distal peak latency (4.3 ms, N<3.9) The left Median 2nd Digit sensory nerve showed prolonged distal peak latency (4.2 ms, N<3.9)  The right Ulnar ADM motor nerve showed reduced amplitude (3.0 V, N>5). The left Ulnar ADM motor nerve was within normal limits. The bilateral Ulnar F waves were within normal limits. The bilateral Ulnar 5th digit sensory nerves were within normal limits  EMG needle study of selected right upper extremity muscles was performed: The Opponens Pollicis, First Dorsal Interosseous and Extensor Indicis muscles showed increased spontaneous activity. The Deltoid, Triceps, Pronator Teres, Flexor Digitorum Profundus (Ulnar head) muscles were within normal limits.   Conclusion: This is an abnormal study. There is electrophysiologic evidence for acute/ongoing right C8 radiculopathy. There is also concomitant bilateral moderately-severe Carpal Tunnel Syndrome.  No indication for polyneuropathy. Clinical correlation recommended.   Sarina Ill, MD  [facility] [address] [place], [place] [address]  Phone [phone] Fax [phone]

## 2013-12-15 ENCOUNTER — Telehealth: Payer: Self-pay | Admitting: Family Medicine

## 2013-12-15 DIAGNOSIS — M542 Cervicalgia: Secondary | ICD-10-CM

## 2013-12-15 NOTE — Telephone Encounter (Signed)
Patient had a nerve conducting study done yesterday at [facility].  She would like Dr. Richardson Landry to look at those results and see if he would be willing to do injections for her?

## 2013-12-16 MED ORDER — HYDROCODONE-ACETAMINOPHEN 5-325 MG PO TABS: ORAL_TABLET | ORAL | Status: DC

## 2013-12-16 NOTE — Telephone Encounter (Signed)
im sorry that the pt has been working under a misperception, we tried very hard last wk to let her know we would need to see the results of this wks testing before even knowing Which specialist to consult for an injec  Diff docs do elbow vs cerv spine, hhave bren go ahead and initate ref to eintervent rad for cerv spine inje, let pt know when or how shell find out, ref hydrocod

## 2013-12-16 NOTE — Telephone Encounter (Signed)
Calling to request a refill on hydrocodone, because she was unable to get injections like she thought she was going to before the weekend.

## 2013-12-16 NOTE — Telephone Encounter (Signed)
[facility] (referral initiated in the system and [place] was notified). See message below- Can patient have refill on hydrocodone?

## 2013-12-16 NOTE — Telephone Encounter (Signed)
Rx for refill of Hydrocodone given to patient. Patient advised that referral was placed in system and she would be receiving call regarding appointment.

## 2013-12-16 NOTE — Telephone Encounter (Signed)
Call pt, i rev nerve conduction study, results point to the neck as the site of the nerve compression.(at C8 which is nerve exiting between C7 and T1. Needs ref to the interventional radiologists who do this under fluoroscopic guidance--not office procedure

## 2013-12-20 ENCOUNTER — Telehealth: Payer: Self-pay | Admitting: Family Medicine

## 2013-12-20 NOTE — Telephone Encounter (Signed)
Calling to check on referral for interventional radiology.

## 2013-12-20 NOTE — Telephone Encounter (Signed)
Order faxed to [facility], called to notify patient that their office would be contacting her to set up, patient verbalized understanding

## 2013-12-21 ENCOUNTER — Ambulatory Visit: Payer: BC Managed Care – PPO | Admitting: Radiation Oncology

## 2013-12-21 ENCOUNTER — Other Ambulatory Visit: Payer: Self-pay | Admitting: Family Medicine

## 2013-12-21 DIAGNOSIS — T148XXA Other injury of unspecified body region, initial encounter: Secondary | ICD-10-CM

## 2013-12-21 DIAGNOSIS — M542 Cervicalgia: Secondary | ICD-10-CM

## 2013-12-26 ENCOUNTER — Ambulatory Visit
Admission: RE | Admit: 2013-12-26 | Discharge: 2013-12-26 | Disposition: A | Discharge status: Home / Self Care | Payer: BC Managed Care – PPO | Source: Ambulatory Visit | Attending: Family Medicine | Admitting: Family Medicine

## 2013-12-26 VITALS — BP 127/69 | HR 63

## 2013-12-26 DIAGNOSIS — M542 Cervicalgia: Secondary | ICD-10-CM

## 2013-12-26 DIAGNOSIS — T148XXA Other injury of unspecified body region, initial encounter: Secondary | ICD-10-CM

## 2013-12-26 MED ORDER — IOHEXOL 300 MG/ML  SOLN
1.0000 mL | Freq: Once | INTRAMUSCULAR | Status: AC | PRN
  Administered 2013-12-26: 1 mL via EPIDURAL

## 2013-12-26 MED ORDER — TRIAMCINOLONE ACETONIDE 40 MG/ML IJ SUSP (RADIOLOGY)
60.0000 mg | Freq: Once | INTRAMUSCULAR | Status: AC
  Administered 2013-12-26: 60 mg via EPIDURAL

## 2013-12-26 NOTE — Discharge Instructions (Signed)

## 2013-12-29 ENCOUNTER — Inpatient Hospital Stay: Admission: RE | Admit: 2013-12-29 | Payer: Self-pay | Source: Ambulatory Visit | Admitting: Radiation Oncology

## 2014-01-05 ENCOUNTER — Encounter: Payer: Self-pay | Admitting: Radiation Oncology

## 2014-01-05 ENCOUNTER — Ambulatory Visit
Admission: RE | Admit: 2014-01-05 | Discharge: 2014-01-05 | Disposition: A | Discharge status: Home / Self Care | Payer: BC Managed Care – PPO | Source: Ambulatory Visit | Attending: Radiation Oncology | Admitting: Radiation Oncology

## 2014-01-05 VITALS — BP 129/77 | HR 85 | Temp 98.1°F | Resp 12 | Wt 114.4 lb

## 2014-01-05 DIAGNOSIS — C211 Malignant neoplasm of anal canal: Secondary | ICD-10-CM

## 2014-01-05 NOTE — Progress Notes (Signed)
She is currently in no pain. Pt complains of, Loss of Sleep, formed bowel movement every day. She reports that bowel movements are very painful, denies any rectal bleeding.   The patient eats a regular, healthy diet. She reports she pulled a muscle in her right shoulder which caused numbness in her right hand, which occurred 6 weeks ago, she reports she was given an injection of cortisone which has helped the pain.

## 2014-01-06 ENCOUNTER — Encounter: Payer: Self-pay | Admitting: Radiation Oncology

## 2014-01-06 NOTE — Progress Notes (Signed)
Radiation Oncology         [phone]) [phone] ________________________________  Name: [name] MRN: [id number]  Date: 01/05/2014  DOB: 06/29/1950  Follow-Up Visit Note  CC: Rubbie Battiest, MD  Magod, Caryl Bis, MD  Diagnosis:   SCC of the anal canal  Interval Since Last Radiation:  9 months   Narrative:  The patient returns today for routine follow-up.  The patient indicates that she has had some pain with bowel movements. She had some right shoulder pain and was prescribed a medication which led to some constipation. Subsequently she has had some pain with bowel movements even though her bowel movements have become more normalized. Otherwise the patient is doing well. She denies any blood per act. No other areas of pain. The patient is scheduled to see GI medicine in a couple of months.                              ALLERGIES:  is allergic to ciprofloxacin and penicillins.  Meds: Current Outpatient Prescriptions  Medication Sig Dispense Refill  . Cholecalciferol 10000 UNITS CAPS Take 1,000 Units by mouth daily.      . Cyanocobalamin 1500 MCG TBDP Take 1,500 mcg by mouth daily.      Marland Kitchen estradiol (ESTRACE) 1 MG tablet       . hydrochlorothiazide (MICROZIDE) 12.5 MG capsule Take 12.5 mg by mouth daily.      . Omega-3 Fatty Acids (FISH OIL BURP-LESS PO) Take 1 tablet by mouth daily.        Marland Kitchen PROCTOZONE-HC 2.5 % rectal cream       . SUMAtriptan (IMITREX) 25 MG tablet Take by mouth.       No current facility-administered medications for this encounter.    Physical Findings: The patient is in no acute distress. Patient is alert and oriented.  weight is 114 lb 6.4 oz (51.891 kg). Her oral temperature is 98.1 F (36.7 C). Her blood pressure is 129/77 and her pulse is 85. Her respiration is 12 and oxygen saturation is 100%. .   No inguinal lymphadenopathy Rectal:  Hyperpigmentation in the anal region but the skin is healed well and externally. On digital rectal exam, some narrowing of the anal  canal is present with some fibrosis especially anteriorly. No palpable nodule or tumor which felt suspicious today on exam.   Lab Findings: Lab Results  Component Value Date   WBC 3.0* 10/05/2013   HGB 12.2 10/05/2013   HCT 37.3 10/05/2013   MCV 91.2 10/05/2013   PLT 197 10/05/2013     Radiographic Findings: Mr Cervical Spine Wo Contrast  12/09/2013   CLINICAL DATA:  Right neck and shoulder pain with ulnar hand numbness for several weeks following twisting injury. No previous relevant surgery.  EXAM: MRI CERVICAL SPINE WITHOUT CONTRAST  TECHNIQUE: Multiplanar, multisequence MR imaging of the cervical spine was performed. No intravenous contrast was administered.  COMPARISON:  Radiographs 07/01/2011.  FINDINGS: The alignment is near anatomic. There is no evidence of acute fracture or paraspinal ligamentous injury.  The craniocervical junction appears normal. The cervical cord is normal in signal and caliber. There are bilateral vertebral artery flow voids.  C2-3: There is mild uncinate spurring with apparent ankylosis of the right facet joint. There may be partial ankylosis across the disc space. No spinal stenosis or nerve root encroachment  C3-4: Mild disc bulging with bilateral uncinate spurring and asymmetric right-sided facet hypertrophy. There is  moderate right and mild left foraminal stenosis. No cord deformity.  C4-5: Mild disc bulging and bilateral uncinate spurring. There is moderate right greater than left facet hypertrophy. The foramina appear sufficiently patent. No cord deformity.  C5-6: Mild disc bulging, uncinate spurring and facet hypertrophy, worse on the right. Mild biforaminal stenosis. No cord deformity.  C6-7: There is spondylosis with loss of disc height, posterior osteophytes and bilateral uncinate spurring. No significant facet hypertrophy. There is moderate biforaminal stenosis and no cord deformity.  C7-T1: Disc degeneration with asymmetric uncinate spurring and facet hypertrophy  on the right. There is moderate right foraminal stenosis. The left foramen appears only mildly narrowed. No cord deformity.  T1-2: Shallow right paracentral disc protrusion. No cord deformity or foraminal compromise.  IMPRESSION: 1. Uncinate spurring and facet hypertrophy throughout the cervical spine contribute to mild to moderate foraminal stenosis as detailed above. This appears greatest on the right at C3-4 and C7-T1. The latter may contribute to the patient's symptoms on the basis of right C8 nerve root encroachment. 2. No cord deformity or definite acute findings.   Electronically Signed   By: Camie Patience M.D.   On: 12/09/2013 14:07   Dg Epidurography  12/26/2013   CLINICAL DATA:  Right arm and finger numbness. Right C8 radicular symptoms. Spondylosis without myelopathy. Right foraminal stenosis C7-T1.  EXAM: CERVICAL EPIDURAL INJECTION  DIAGNOSTIC EPIDURAL INJECTION  THERAPEUTIC EPIDURAL INJECTION  COMPARISON:  MRI 12/09/2013  PROCEDURE: CERVICAL EPIDURAL INJECTION  The overlying skin was scrubbed with Betadine and draped in sterile fashion. Skin anesthesia was carried out using 1% Lidocaine. An interlaminar approach was performed on the right at C7-T1.A 20 gauge Crawford epidural needle was advanced using loss-of-resistance technique.  DIAGNOSTIC EPIDURAL INJECTION  Injection of Omnipaque 300 shows a good epidural pattern with spread above and below the level of needle placement, primarily on the right. No vascular opacification is seen.  THERAPEUTIC EPIDURAL INJECTION  1.5 cc of Kenalog 40 mixed with 1 cc of 1% Lidocaine and 2 cc of normal saline were then instilled. The procedure was well-tolerated, and the patient was discharged thirty minutes following the injection in good condition.  FLUOROSCOPY TIME:  0 min 36 seconds.  0.026 Gy cm sq  IMPRESSION: Technically successful initial epidural injection on the right at C7-T1.   Electronically Signed   By: Nelson Chimes M.D.   On: 12/26/2013 09:42     Impression:    The patient clinically is doing reasonably well although she has had some pain with bowel movements. This has been associated with a history of constipation. I recommended for the patient to begin using Metamucil on a daily basis to cause some softer stools. She also is going to schedule her visit with GI medicine sooner next month. Her last exam with them was good but it would be reasonable to check once again for any suspicious findings. He feels on exam as though she has had some narrowing which is consistent with her complaints today.   Plan:  Followup of both GI medicine and medical oncology. The patient will see [place] once again in 6 months.  I spent 15 minutes with the patient today, the majority of which was spent counseling the patient on the diagnosis of cancer and coordinating care.   Jodelle Gross, M.D., Ph.D.

## 2014-04-03 ENCOUNTER — Telehealth: Payer: Self-pay | Admitting: Oncology

## 2014-04-03 NOTE — Telephone Encounter (Signed)
LISA OUT PER DR SHERRILL MOVE UP TO 2 WEEKS OUT - R/S FROM 1/19 TO 1/29 - SPOKE WITH  PT SHE IS AWARE.

## 2014-04-04 ENCOUNTER — Ambulatory Visit: Payer: BC Managed Care – PPO | Admitting: Nurse Practitioner

## 2014-04-06 ENCOUNTER — Ambulatory Visit (INDEPENDENT_AMBULATORY_CARE_PROVIDER_SITE_OTHER): Payer: BC Managed Care – PPO | Admitting: Family Medicine

## 2014-04-06 ENCOUNTER — Encounter: Payer: Self-pay | Admitting: Family Medicine

## 2014-04-06 VITALS — BP 130/80 | Temp 97.9°F | Ht 63.0 in | Wt 119.2 lb

## 2014-04-06 DIAGNOSIS — N390 Urinary tract infection, site not specified: Secondary | ICD-10-CM

## 2014-04-06 LAB — POCT URINALYSIS DIPSTICK: pH, UA: 7

## 2014-04-06 MED ORDER — NITROFURANTOIN MONOHYD MACRO 100 MG PO CAPS: 100.0000 mg | ORAL_CAPSULE | Freq: Two times a day (BID) | ORAL | Status: AC

## 2014-04-06 NOTE — Patient Instructions (Signed)
azostandard up to four times per d  Increase fluid intake

## 2014-04-06 NOTE — Progress Notes (Signed)
   Subjective:    Patient ID: [name], female    DOB: 10-29-1950, 64 y.o.   MRN: [id number]  Urinary Tract Infection  This is a new problem. The current episode started in the past 7 days. The problem has been unchanged. The quality of the pain is described as burning. The pain is moderate. There has been no fever. Associated symptoms include flank pain. She has tried nothing for the symptoms. The treatment provided no relief.   Patient states that she has no other concerns at this time.  incr freq sat,  Noticed dysureia  Pain and uncomfort  Eased off and not as painfula  Just ignored the discomfort    loe abd tend , some nocturia  No fever or chills..   Last urinary tract infection 1 year ago. Review of Systems  Genitourinary: Positive for flank pain.   No fever no chills no vomiting pain primarily diffuse low back. And mild suprapubic    Objective:   Physical Exam  Alert no apparent distress. Vitals stable no CVA tenderness low back slight tenderness to percussion lungs clear heart regular in rhythm  UA 4-6 [place] blood cells per high-power field    Assessment & Plan:  Impression urinary tract infection plan since Medicare discussed. Warning signs discussed. Antibiotics prescribed. WSL

## 2014-04-13 ENCOUNTER — Telehealth: Payer: Self-pay | Admitting: Family Medicine

## 2014-04-13 MED ORDER — SULFAMETHOXAZOLE-TRIMETHOPRIM 800-160 MG PO TABS: 1.0000 | ORAL_TABLET | Freq: Two times a day (BID) | ORAL | Status: DC

## 2014-04-13 NOTE — Telephone Encounter (Signed)
Patient said that she was diagnosed with a UTI on 04/06/2014 and has taken all of the medications that she was given.  She said it is just not a whole lot better.  She wants to know what Dr. Richardson Landry recommends?

## 2014-04-13 NOTE — Telephone Encounter (Signed)
Per Dr. Nicki Reaper- Bactrim DS 1 tab BID x 7 days. Medication sent to pharmacy. Patient was notified.

## 2014-04-14 ENCOUNTER — Telehealth: Payer: Self-pay

## 2014-04-14 ENCOUNTER — Ambulatory Visit (HOSPITAL_BASED_OUTPATIENT_CLINIC_OR_DEPARTMENT_OTHER): Payer: BC Managed Care – PPO | Admitting: Nurse Practitioner

## 2014-04-14 ENCOUNTER — Telehealth: Payer: Self-pay | Admitting: Oncology

## 2014-04-14 VITALS — BP 119/68 | HR 72 | Temp 97.6°F | Resp 18 | Ht 63.0 in | Wt 122.6 lb

## 2014-04-14 DIAGNOSIS — C20 Malignant neoplasm of rectum: Secondary | ICD-10-CM

## 2014-04-14 DIAGNOSIS — Z809 Family history of malignant neoplasm, unspecified: Secondary | ICD-10-CM

## 2014-04-14 DIAGNOSIS — E860 Dehydration: Secondary | ICD-10-CM

## 2014-04-14 DIAGNOSIS — R197 Diarrhea, unspecified: Secondary | ICD-10-CM

## 2014-04-14 DIAGNOSIS — R112 Nausea with vomiting, unspecified: Secondary | ICD-10-CM

## 2014-04-14 DIAGNOSIS — C211 Malignant neoplasm of anal canal: Secondary | ICD-10-CM

## 2014-04-14 NOTE — Telephone Encounter (Signed)
Called Dr.Magod's office to request Flex Sig results that were done in Nov 2015. Spoke with Marcene Brawn, who will be faxing these results over. Fax received, copy given to [name], NP and copy to medical records to scan.

## 2014-04-14 NOTE — Telephone Encounter (Signed)
gv and prnted appt sched and avs for pt for July

## 2014-04-14 NOTE — Progress Notes (Signed)
  [facility] OFFICE PROGRESS NOTE   Diagnosis:  Anal cancer  INTERVAL HISTORY:   Ms. Maves returns as scheduled. She feels well. She has a good appetite. Bowels moving regularly. No bloody or black stools. No pain with bowel movements. She does note thin stools. At present she has a urinary tract infection and is on an antibiotic.  Objective:  Vital signs in last 24 hours:  Blood pressure 119/68, pulse 72, temperature 97.6 F (36.4 C), temperature source Oral, resp. rate 18, height 5\' 3"  (1.6 m), weight 122 lb 9.6 oz (55.611 kg), SpO2 100 %.    HEENT: No thrush or ulcers. Lymphatics: No palpable cervical, supra clavicular, axillary or inguinal lymph nodes. Resp: Lungs clear bilaterally. Cardio: Regular rate and rhythm. GI: Abdomen soft and nontender. No hepatomegaly. No mass. The anus is stenotic. At the anterior anal verge there is a small rounded hemorrhoid. Vascular: No leg edema.  Lab Results:  Lab Results  Component Value Date   WBC 3.0* 10/05/2013   HGB 12.2 10/05/2013   HCT 37.3 10/05/2013   MCV 91.2 10/05/2013   PLT 197 10/05/2013   NEUTROABS 1.8 10/05/2013    Imaging:  No results found.  Medications: I have reviewed the patient's current medications.  Assessment/Plan: 1. 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  Staging CT of the abdomen/pelvis 01/24/2013 revealed small perirectal lymph nodes and a 6 mm left internal iliac node.   Radiation and cycle 1 5-FU/mitomycin C. initiated on 02/21/2013.   Cycle 2 5-FU/mitomycin-C 03/28/2013, radiation completed 04/04/2013.  Flexible sigmoidoscopy 01/17/2014. The anus, rectum, rectosigmoid colon, sigmoid colon, descending colon and distal transverse colon were normal. Repeat flexible sigmoidoscopy planned in one year for screening purposes. 2. Pericarditis 2006. 3. History of thrombocytopenia. Question ITP. 4. Status post TAH/BSO. 5. Family history  multiple cancers. 6. History of mucositis secondary to chemotherapy. 7. History of Pancytopenia secondary chemotherapy.  8. 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 The dysuria has resolved. 9. Rash and skin breakdown at the perineum/groin-resolved. 10. Dehydration 04/07/2013. She received intravenous fluids. 11. Nausea/vomiting and diarrhea following cycle 2 5-FU and mitomycin C. 12. Allergic reaction to ciprofloxacin 04/07/2012 with "mouth tingling and swelling". Evaluated in the emergency Department.   Disposition: Ms. Cyran remains in clinical remission from anal cancer. She will return for a follow-up visit in 6 months.  Plan reviewed with Dr. Benay Spice.  [name] ANP/GNP-BC   04/14/2014  10:38 AM

## 2014-06-07 ENCOUNTER — Other Ambulatory Visit: Payer: Self-pay | Admitting: Obstetrics and Gynecology

## 2014-06-08 LAB — CYTOLOGY - PAP

## 2014-06-28 ENCOUNTER — Encounter: Payer: Self-pay | Admitting: Radiation Oncology

## 2014-06-28 ENCOUNTER — Ambulatory Visit
Admission: RE | Admit: 2014-06-28 | Discharge: 2014-06-28 | Disposition: A | Discharge status: Home / Self Care | Payer: BC Managed Care – PPO | Source: Ambulatory Visit | Attending: Radiation Oncology | Admitting: Radiation Oncology

## 2014-06-28 VITALS — BP 115/67 | HR 85 | Temp 98.5°F | Resp 20 | Ht 63.0 in | Wt 119.6 lb

## 2014-06-28 DIAGNOSIS — C211 Malignant neoplasm of anal canal: Secondary | ICD-10-CM

## 2014-06-28 NOTE — Progress Notes (Signed)
Radiation Oncology         [phone] ________________________________  Name: [name] MRN: [id number]  Date: 06/28/2014  DOB: 1950-07-15  Follow-Up Visit Note  CC: Rubbie Battiest, MD  Clarene Essex, MD  Diagnosis:   SCC of the anal canal  Interval Since Last Radiation:  15 months   Narrative:  The patient returns today for routine follow-up.  Up s/p rad txs anal canal 02/21/13-04/04/13. Having regular bowel movements, no nausea, or gas, appetite good energy level good, did have some discomfort/constipation last night and is sore on her bottom, no bleeding,"I feel like I'm back to normal" 2:17 PM BP 115/67 mmHg  Pulse 85  Temp(Src) 98.5 F (36.9 C) (Oral)  Resp 20  Ht 5\' 3"  (1.6 m)  Wt 119 lb 9.6 oz (54.25 kg)  BMI 21.19 kg/m2  Wt Readings from Last 3 Encounters:  04/14/14 122 lb 9.6 oz (55.611 kg)  04/06/14 119 lb 4 oz (54.091 kg)  12/06/13 115 lb (52.164 kg)                  ALLERGIES:  is allergic to ciprofloxacin and penicillins.  Meds: Current Outpatient Prescriptions  Medication Sig Dispense Refill  . Cholecalciferol 10000 UNITS CAPS Take 1,000 Units by mouth daily.    . Cyanocobalamin 1500 MCG TBDP Take 1,500 mcg by mouth daily.    Marland Kitchen estradiol (ESTRACE) 1 MG tablet     . hydrochlorothiazide (MICROZIDE) 12.5 MG capsule Take 12.5 mg by mouth daily.    . Omega-3 Fatty Acids (FISH OIL BURP-LESS PO) Take 1 tablet by mouth daily.      Marland Kitchen PROCTOZONE-HC 2.5 % rectal cream     . SUMAtriptan (IMITREX) 25 MG tablet Take by mouth.     No current facility-administered medications for this encounter.    Physical Findings: The patient is in no acute distress. Patient is alert and oriented.  height is 5\' 3"  (1.6 m) and weight is 119 lb 9.6 oz (54.25 kg). Her oral temperature is 98.5 F (36.9 C). Her blood pressure is 115/67 and her pulse is 85. Her respiration is 20. Marland Kitchen   No inguinal lymphadenopathy Rectal: Some stenosis noted. No suspicious findings.  Lab Findings: Lab  Results  Component Value Date   WBC 3.0* 10/05/2013   HGB 12.2 10/05/2013   HCT 37.3 10/05/2013   MCV 91.2 10/05/2013   PLT 197 10/05/2013     Radiographic Findings: Mr Cervical Spine Wo Contrast  12/09/2013   CLINICAL DATA:  Right neck and shoulder pain with ulnar hand numbness for several weeks following twisting injury. No previous relevant surgery.  EXAM: MRI CERVICAL SPINE WITHOUT CONTRAST  TECHNIQUE: Multiplanar, multisequence MR imaging of the cervical spine was performed. No intravenous contrast was administered.  COMPARISON:  Radiographs 07/01/2011.  FINDINGS: The alignment is near anatomic. There is no evidence of acute fracture or paraspinal ligamentous injury.  The craniocervical junction appears normal. The cervical cord is normal in signal and caliber. There are bilateral vertebral artery flow voids.  C2-3: There is mild uncinate spurring with apparent ankylosis of the right facet joint. There may be partial ankylosis across the disc space. No spinal stenosis or nerve root encroachment  C3-4: Mild disc bulging with bilateral uncinate spurring and asymmetric right-sided facet hypertrophy. There is moderate right and mild left foraminal stenosis. No cord deformity.  C4-5: Mild disc bulging and bilateral uncinate spurring. There is moderate right greater than left facet hypertrophy. The foramina appear sufficiently patent.  No cord deformity.  C5-6: Mild disc bulging, uncinate spurring and facet hypertrophy, worse on the right. Mild biforaminal stenosis. No cord deformity.  C6-7: There is spondylosis with loss of disc height, posterior osteophytes and bilateral uncinate spurring. No significant facet hypertrophy. There is moderate biforaminal stenosis and no cord deformity.  C7-T1: Disc degeneration with asymmetric uncinate spurring and facet hypertrophy on the right. There is moderate right foraminal stenosis. The left foramen appears only mildly narrowed. No cord deformity.  T1-2: Shallow  right paracentral disc protrusion. No cord deformity or foraminal compromise.  IMPRESSION: 1. Uncinate spurring and facet hypertrophy throughout the cervical spine contribute to mild to moderate foraminal stenosis as detailed above. This appears greatest on the right at C3-4 and C7-T1. The latter may contribute to the patient's symptoms on the basis of right C8 nerve root encroachment. 2. No cord deformity or definite acute findings.   Electronically Signed   By: Camie Patience M.D.   On: 12/09/2013 14:07   Dg Epidurography  12/26/2013   CLINICAL DATA:  Right arm and finger numbness. Right C8 radicular symptoms. Spondylosis without myelopathy. Right foraminal stenosis C7-T1.  EXAM: CERVICAL EPIDURAL INJECTION  DIAGNOSTIC EPIDURAL INJECTION  THERAPEUTIC EPIDURAL INJECTION  COMPARISON:  MRI 12/09/2013  PROCEDURE: CERVICAL EPIDURAL INJECTION  The overlying skin was scrubbed with Betadine and draped in sterile fashion. Skin anesthesia was carried out using 1% Lidocaine. An interlaminar approach was performed on the right at C7-T1.A 20 gauge Crawford epidural needle was advanced using loss-of-resistance technique.  DIAGNOSTIC EPIDURAL INJECTION  Injection of Omnipaque 300 shows a good epidural pattern with spread above and below the level of needle placement, primarily on the right. No vascular opacification is seen.  THERAPEUTIC EPIDURAL INJECTION  1.5 cc of Kenalog 40 mixed with 1 cc of 1% Lidocaine and 2 cc of normal saline were then instilled. The procedure was well-tolerated, and the patient was discharged thirty minutes following the injection in good condition.  FLUOROSCOPY TIME:  0 min 36 seconds.  0.026 Gy cm sq  IMPRESSION: Technically successful initial epidural injection on the right at C7-T1.   Electronically Signed   By: Nelson Chimes M.D.   On: 12/26/2013 09:42    Impression:    The patient clinically is doing reasonably well although she has had some pain with bowel movements.  Plan:  Followup of  both GI medicine and medical oncology. The patient will see [place] once again in 6 months.  I spent 15 minutes with the patient today, the majority of which was spent counseling the patient on the diagnosis of cancer and coordinating care.  This document serves as a record of services personally performed by Kyung Rudd, MD. It was created on his behalf by Pearlie Oyster, a trained medical scribe. The creation of this record is based on the scribe's personal observations and the provider's statements to them. This document has been checked and approved by the attending provider.   Jodelle Gross, M.D., Ph.D.

## 2014-06-28 NOTE — Progress Notes (Signed)
Up s/p rad txs anal canal 02/21/13-04/04/13. Having regular bowel movements, no nausea, or gas, appetite good energy level good, did have some discomfort/constipation last night and is sore on her bottom  ,no bleeding,"I feel like I'm back to normal" 1:57 PM BP 115/67 mmHg  Pulse 85  Temp(Src) 98.5 F (36.9 C) (Oral)  Resp 20  Ht 5\' 3"  (1.6 m)  Wt 119 lb 9.6 oz (54.25 kg)  BMI 21.19 kg/m2  Wt Readings from Last 3 Encounters:  04/14/14 122 lb 9.6 oz (55.611 kg)  04/06/14 119 lb 4 oz (54.091 kg)  12/06/13 115 lb (52.164 kg)

## 2014-07-19 ENCOUNTER — Encounter: Payer: Self-pay | Admitting: Oncology

## 2014-08-10 ENCOUNTER — Telehealth: Payer: Self-pay | Admitting: Oncology

## 2014-08-10 NOTE — Telephone Encounter (Signed)
Due to CME moved 7/29 f/u to 8/8. Left message for patient and mailed schedule.

## 2014-10-13 ENCOUNTER — Ambulatory Visit: Payer: BC Managed Care – PPO | Admitting: Oncology

## 2014-10-23 ENCOUNTER — Ambulatory Visit (HOSPITAL_BASED_OUTPATIENT_CLINIC_OR_DEPARTMENT_OTHER): Payer: BC Managed Care – PPO | Admitting: Oncology

## 2014-10-23 ENCOUNTER — Telehealth: Payer: Self-pay | Admitting: Oncology

## 2014-10-23 VITALS — BP 131/67 | HR 70 | Temp 98.0°F | Resp 18 | Ht 63.0 in | Wt 118.8 lb

## 2014-10-23 DIAGNOSIS — G893 Neoplasm related pain (acute) (chronic): Secondary | ICD-10-CM

## 2014-10-23 DIAGNOSIS — C211 Malignant neoplasm of anal canal: Secondary | ICD-10-CM

## 2014-10-23 NOTE — Telephone Encounter (Signed)
Pt confirmed MD visit per 08/08 POF, gave pt avs and calendar... KJ

## 2014-10-23 NOTE — Progress Notes (Signed)
  [facility] OFFICE PROGRESS NOTE   Diagnosis: Anal cancer  INTERVAL HISTORY:   [name] returns as scheduled. She feels well. Good appetite. She complains of pain with bowel movements. She also has rectal urgency. No incontinence.  Objective:  Vital signs in last 24 hours:  Blood pressure 131/67, pulse 70, temperature 98 F (36.7 C), temperature source Oral, resp. rate 18, height 5\' 3"  (1.6 m), weight 118 lb 12.8 oz (53.887 kg), SpO2 100 %.    HEENT: Neck without mass Lymphatics: No cervical, supra-clavicular, axillary, or inguinal nodes Resp: Lungs clear bilaterally Cardio: Regular rate and rhythm GI: No hepatosplenomegaly, nontender, no mass Rectal: Small hemorrhoid at the posterior anal verge, stenotic anal canal approximately 2 cm proximal to the anal verge with soft mobile nodularity anteriorly Vascular: No leg edema  Skin: Radiation pigment changes at the perineum     Medications: I have reviewed the patient's current medications.  Assessment/Plan: 1. 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  Staging CT of the abdomen/pelvis 01/24/2013 revealed small perirectal lymph nodes and a 6 mm left internal iliac node.   Radiation and cycle 1 5-FU/mitomycin C. initiated on 02/21/2013.   Cycle 2 5-FU/mitomycin-C 03/28/2013, radiation completed 04/04/2013.  Flexible sigmoidoscopy 01/17/2014. The anus, rectum, rectosigmoid colon, sigmoid colon, descending colon and distal transverse colon were normal. Repeat flexible sigmoidoscopy planned in one year for screening purposes. 2. Pericarditis 2006. 3. History of thrombocytopenia. Question ITP. 4. Status post TAH/BSO. 5. Family history multiple cancers. 6. History of mucositis secondary to chemotherapy. 7. History of Pancytopenia secondary chemotherapy.  8. 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 The dysuria has resolved. 9. Rash and skin breakdown at the  perineum/groin-resolved. 10. Dehydration 04/07/2013. She received intravenous fluids. 11. Nausea/vomiting and diarrhea following cycle 2 5-FU and mitomycin C. 12. Allergic reaction to ciprofloxacin 04/07/2012 with "mouth tingling and swelling". Evaluated in the emergency Department. 13. Pain with bowel movements-likely secondary to a rectal stricture   Disposition:  [name] remains in clinical remission from anal cancer. 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 I will refer her to Dr. Watt Climes for evaluation and management. She is scheduled to see Dr. Lisbeth Renshaw in October. She will return for an office visit here in 8 months.  Betsy Coder, MD  10/23/2014  12:54 PM

## 2014-10-25 ENCOUNTER — Telehealth: Payer: Self-pay | Admitting: Oncology

## 2014-10-25 NOTE — Telephone Encounter (Signed)
Faxed pt medical records to Dr. Watt Climes [phone]

## 2014-12-13 ENCOUNTER — Other Ambulatory Visit: Payer: Self-pay | Admitting: Family Medicine

## 2014-12-20 ENCOUNTER — Encounter: Payer: Self-pay | Admitting: Radiation Oncology

## 2014-12-20 ENCOUNTER — Ambulatory Visit
Admission: RE | Admit: 2014-12-20 | Discharge: 2014-12-20 | Disposition: A | Discharge status: Home / Self Care | Payer: BC Managed Care – PPO | Source: Ambulatory Visit | Attending: Radiation Oncology | Admitting: Radiation Oncology

## 2014-12-20 VITALS — BP 120/80 | HR 74 | Temp 97.8°F | Resp 20 | Wt 120.7 lb

## 2014-12-20 DIAGNOSIS — C211 Malignant neoplasm of anal canal: Secondary | ICD-10-CM

## 2014-12-20 NOTE — Progress Notes (Signed)
  Radiation Oncology         [phone]) [phone] ________________________________  Name: [name] MRN: [id number]  Date: 12/20/2014  DOB: 25-Jun-1950  Follow-Up Visit Note  CC: Mickie Hillier, MD  Clarene Essex, MD  Diagnosis:  SCC of the anal canal  Interval Since Last Radiation: Completed radiation on 04/04/13  Narrative:  The patient returns today for routine follow-up.  Follow up s/p rad tx anal 02/21/13-04/04/13, will see Dr. Watt Climes in November 7,2016,For sigmoidoscopy and possible stretching , states her rectal spinchter size of a pencil and at times difficulty with bowel movements with minor pain and bleeding, but has a bowel movement daily, appetite good, energy level good, stopped physical therapy at time radiation given, talked about a dilator to start using possibly.   Patient denies any other pains or issues.                        ALLERGIES:  is allergic to ciprofloxacin and penicillins.  Meds: Current Outpatient Prescriptions  Medication Sig Dispense Refill  . ALPRAZolam (XANAX) 0.5 MG tablet Takes at bedtime as needed    . Cholecalciferol 10000 UNITS CAPS Take 1,000 Units by mouth daily.    . Cyanocobalamin 1500 MCG TBDP Take 1,500 mcg by mouth daily.    Marland Kitchen estradiol (ESTRACE) 1 MG tablet     . hydrochlorothiazide (MICROZIDE) 12.5 MG capsule Take 12.5 mg by mouth daily.    . Omega-3 Fatty Acids (FISH OIL BURP-LESS PO) Take 1 tablet by mouth daily.      Marland Kitchen PROCTOZONE-HC 2.5 % rectal cream Place 1 application rectally as needed.     . SUMAtriptan (IMITREX) 25 MG tablet Take 25 mg by mouth as needed for migraine.      No current facility-administered medications for this encounter.    Physical Findings: The patient is in no acute distress. Patient is alert and oriented.  weight is 120 lb 11.2 oz (54.749 kg). Her oral temperature is 97.8 F (36.6 C). Her blood pressure is 120/80 and her pulse is 74. Her respiration is 20. Marland Kitchen   No inguinal lymphadenopathy Skin has healed well  externally in the anal region Patient has significant narrowing of the sphincter that limited digital rectal exam Mild fibrosis found on digital rectal exam but not concerning  Lab Findings: Lab Results  Component Value Date   WBC 3.0* 10/05/2013   HGB 12.2 10/05/2013   HCT 37.3 10/05/2013   MCV 91.2 10/05/2013   PLT 197 10/05/2013   Radiographic Findings: No results found.  Impression:  The patient is experiencing narrowing of rectal sphincter and is having issues with pain and bleeding during bowel movements otherwise clinically stable.  Plan:  The patient will meet with Dr.Magod for sigmoidoscopy and dilatation. The patient will follow up with radiation oncology in 6 months.    ------------------------------------------------  Jodelle Gross, MD, PhD  This document serves as a record of services personally performed by Kyung Rudd, MD. It was created on his behalf by Derek Mound, a trained medical scribe. The creation of this record is based on the scribe's personal observations and the provider's statements to them. This document has been checked and approved by the attending provider.

## 2014-12-20 NOTE — Progress Notes (Signed)
Follow up s/p rad tx anal 02/21/13-04/04/13, will see Dr. Watt Climes in November 7,2016,   For sigmoidoscopy and possible stretching , states her rectal spinchter size of a pencil and at times difficulty with bowel movements, but has a bowel movement daily, appetite good, energy level good, stopped physical therapy at time radiation given, talked about a dilator to start using possibly 1:46 PM BP 120/80 mmHg  Pulse 74  Temp(Src) 97.8 F (36.6 C) (Oral)  Resp 20  Wt 120 lb 11.2 oz (54.749 kg)  Wt Readings from Last 3 Encounters:  12/20/14 120 lb 11.2 oz (54.749 kg)  10/23/14 118 lb 12.8 oz (53.887 kg)  06/28/14 119 lb 9.6 oz (54.25 kg)

## 2015-04-26 ENCOUNTER — Ambulatory Visit (INDEPENDENT_AMBULATORY_CARE_PROVIDER_SITE_OTHER): Payer: BC Managed Care – PPO | Admitting: Family Medicine

## 2015-04-26 ENCOUNTER — Encounter: Payer: Self-pay | Admitting: Family Medicine

## 2015-04-26 VITALS — BP 112/72 | Ht 63.0 in | Wt 121.8 lb

## 2015-04-26 DIAGNOSIS — I309 Acute pericarditis, unspecified: Secondary | ICD-10-CM | POA: Diagnosis not present

## 2015-04-26 MED ORDER — PREDNISONE 20 MG PO TABS: ORAL_TABLET | ORAL | Status: DC

## 2015-04-26 NOTE — Progress Notes (Signed)
   Subjective:    Patient ID: [name], female    DOB: May 04, 1950, 65 y.o.   MRN: [id number]  HPI Patient arrives office with chest discomfort. She feels it is her pericarditis acting up.   Patient arrives with c/o pericarditis. Patient states se has a history of flares from time to time that was handled with prednisone from cardiologist Dr Verl Blalock.    Patient states her cardiologist Dr Verl Blalock retired and she needs rx of prednisone for current flare ans would like referral to another cardiologist.   2006 had sig pericardial effusion and window and ten days of hospitalization  Has had recurrences since then  Develops chest discomfort and belching and sig sob with exertion  Usually walks without  Problem  Occurs periodically no obv cause +  No nausea no diaphoresis.  Review of Systems No headache no abdominal pain no change bowel habits ROS otherwise negative    Objective:   Physical Exam  Alert vitals stable. Lungs clear. Heart regular rate and rhythm. No crackles auscultated no crepitations heard back normal ankles without edema      Assessment & Plan:  Impression potential flare of pericarditis. Advise patients very difficult to know based on symptomatology alone since maneuver symptoms and resected with other medical problems plan empirical trial of prednisone taper. Patient needs regular cardiologist and she agrees with that. Cardiology referral 25 minutes spent most in discussion WSL

## 2015-05-18 ENCOUNTER — Ambulatory Visit (INDEPENDENT_AMBULATORY_CARE_PROVIDER_SITE_OTHER): Payer: BC Managed Care – PPO | Admitting: Cardiovascular Disease

## 2015-05-18 ENCOUNTER — Encounter: Payer: Self-pay | Admitting: Cardiovascular Disease

## 2015-05-18 VITALS — BP 108/80 | HR 92 | Ht 63.0 in | Wt 110.0 lb

## 2015-05-18 DIAGNOSIS — I3139 Other pericardial effusion (noninflammatory): Secondary | ICD-10-CM

## 2015-05-18 DIAGNOSIS — I309 Acute pericarditis, unspecified: Secondary | ICD-10-CM

## 2015-05-18 DIAGNOSIS — I319 Disease of pericardium, unspecified: Secondary | ICD-10-CM

## 2015-05-18 DIAGNOSIS — I313 Pericardial effusion (noninflammatory): Secondary | ICD-10-CM

## 2015-05-18 DIAGNOSIS — R079 Chest pain, unspecified: Secondary | ICD-10-CM | POA: Diagnosis not present

## 2015-05-18 NOTE — Patient Instructions (Signed)
Medication Instructions:  STOP HZTZ  Labwork: NONE  Testing/Procedures: Your physician has requested that you have a cardiac MRI. Cardiac MRI uses a computer to create images of your heart as its beating, producing both still and moving pictures of your heart and major blood vessels. For further information please visit [email or web address] Please follow the instruction sheet given to you today for more information.    Follow-Up: Your physician recommends that you schedule a follow-up appointment in: [address] [place] [address]   Any Other Special Instructions Will Be Listed Below (If Applicable).     If you need a refill on your cardiac medications before your next appointment, please call your pharmacy.

## 2015-05-18 NOTE — Progress Notes (Signed)
Patient ID: [name], female   DOB: 1950/04/16, 65 y.o.   MRN: [id number]       CARDIOLOGY CONSULT NOTE  Patient ID: [name] MRN: [id number] DOB/AGE: [birth year] 65 y.o.  Admit date: (Not on file) Primary Physician Mickie Hillier, MD  Reason for Consultation: pericarditis  HPI: The patient is a 65 year old woman who used to see Dr. Verl Blalock several years ago. She has a history of pericarditis and reportedly had a significant pericardial effusion in 2006 followed by a window. She takes prednisone for flares. The most recent echocardiogram I find is from March 2007 which demonstrated normal left ventricular systolic function, EF [id number], with no signal began pericardial effusion. ECG today shows sinus rhythm with a left anterior fascicular block.  She has been prescribed hydrochlorothiazide 12.5 mg daily and she used to develop leg swelling during times of pericarditis flares, but this has not happened recently. Her most recent flare was this past January. She walks vigorously daily and at least 10 miles per week without exertional chest pain. She denies premature fatigue. She denies orthopnea and paroxysmal nocturnal dyspnea. She has taken Motrin in the past for flares without relief.  She occasionally experiences a tugging sensation in her retrosternal region when she lies down.  Fam: Father had MI at 15, died of MI at 28. He was a smoker. She is a non-smoker.  Allergies  Allergen Reactions  . Ciprofloxacin Anaphylaxis    Swelling to throat, face, lips  . Penicillins Itching and Rash    Current Outpatient Prescriptions  Medication Sig Dispense Refill  . Cholecalciferol 10000 UNITS CAPS Take 1,000 Units by mouth daily.    . Cyanocobalamin 1500 MCG TBDP Take 1,500 mcg by mouth daily.    Marland Kitchen estradiol (ESTRACE) 1 MG tablet     . hydrochlorothiazide (MICROZIDE) 12.5 MG capsule Take 12.5 mg by mouth daily.    . Omega-3 Fatty Acids (FISH OIL BURP-LESS PO) Take 1 tablet by mouth daily.       . predniSONE (DELTASONE) 20 MG tablet 3 tablets a day for 3 days, then 2 tablets a day for 3 days, then one tablet a day for 2 days 17 tablet 0  . PROCTOZONE-HC 2.5 % rectal cream Place 1 application rectally as needed.     . SUMAtriptan (IMITREX) 25 MG tablet Take 25 mg by mouth as needed for migraine.      No current facility-administered medications for this visit.    Past Medical History  Diagnosis Date  . Perimenopausal   . Myocarditis, unspecified ([place])   . Unspecified disease of pericardium   . Pericardial effusion   . Pericarditis   . Headache(784.0)   . Plantar fasciitis of right foot   . Allergic sinusitis   . Pericarditis     history 2006 from virus  . Cancer ([place]) 01/21/13    rectum  . Hx of radiation therapy 02/21/13-04/04/13    rectal 50.4Gy/67fx    Past Surgical History  Procedure Laterality Date  . Pericardial window on november 11,2006, by dr bartle    . Other surgical history      post hysterectomy 1991  . Abdominal hysterectomy    . Colonoscopy    . Orif clavicular fracture  07/15/2011    Procedure: OPEN REDUCTION INTERNAL FIXATION (ORIF) CLAVICULAR FRACTURE;  Surgeon: Nita Sells, MD;  Location: [facility];  Service: Orthopedics;  Laterality: Right;    Social History   Social History  . Marital Status:  Married    Spouse Name: N/A  . Number of Children: N/A  . Years of Education: N/A   Occupational History  . Not on file.   Social History Main Topics  . Smoking status: Never Smoker   . Smokeless tobacco: Not on file  . Alcohol Use: Yes     Comment: occasional  . Drug Use: No  . Sexual Activity: Yes   Other Topics Concern  . Not on file   Social History Narrative   She lives in [place] with her husband. She teaches 8 th grade social studies .She is married and has one child. She denies any  Tobacco, alcohol, or drug use. She does not routinely exercise     No family history of premature CAD in 1st degree  relatives.  Prior to Admission medications   Medication Sig Start Date End Date Taking? Authorizing Provider  Cholecalciferol 10000 UNITS CAPS Take 1,000 Units by mouth daily.   Yes Historical Provider, MD  Cyanocobalamin 1500 MCG TBDP Take 1,500 mcg by mouth daily.   Yes Historical Provider, MD  estradiol (ESTRACE) 1 MG tablet  05/18/13  Yes Historical Provider, MD  hydrochlorothiazide (MICROZIDE) 12.5 MG capsule Take 12.5 mg by mouth daily.   Yes Historical Provider, MD  Omega-3 Fatty Acids (FISH OIL BURP-LESS PO) Take 1 tablet by mouth daily.     Yes Historical Provider, MD  predniSONE (DELTASONE) 20 MG tablet 3 tablets a day for 3 days, then 2 tablets a day for 3 days, then one tablet a day for 2 days 04/26/15  Yes Mikey Kirschner, MD  PROCTOZONE-HC 2.5 % rectal cream Place 1 application rectally as needed.  09/26/13  Yes Historical Provider, MD  SUMAtriptan (IMITREX) 25 MG tablet Take 25 mg by mouth as needed for migraine.    Yes Historical Provider, MD     Review of systems complete and found to be negative unless listed above in HPI     Physical exam Blood pressure 108/80, pulse 92, height 5\' 3"  (1.6 m), weight 110 lb (49.896 kg). General: NAD Neck: No JVD, no thyromegaly or thyroid nodule.  Lungs: Clear to auscultation bilaterally with normal respiratory effort. CV: Nondisplaced PMI. Regular rate and rhythm, normal S1/S2, no S3/S4, no murmur.  No peripheral edema.  No carotid bruit.  Normal pedal pulses.  Abdomen: Soft, nontender, no hepatosplenomegaly, no distention.  Skin: Intact without lesions or rashes.  Neurologic: Alert and oriented x 3.  Psych: Normal affect. Extremities: No clubbing or cyanosis.  HEENT: Normal.   ECG: Most recent ECG reviewed.  Labs:   Lab Results  Component Value Date   WBC 3.0* 10/05/2013   HGB 12.2 10/05/2013   HCT 37.3 10/05/2013   MCV 91.2 10/05/2013   PLT 197 10/05/2013   No results for input(s): NA, K, CL, CO2, BUN, CREATININE, CALCIUM,  PROT, BILITOT, ALKPHOS, ALT, AST, GLUCOSE in the last 168 hours.  Invalid input(s): LABALBU No results found for: CKTOTAL, CKMB, CKMBINDEX, TROPONINI No results found for: CHOL No results found for: HDL No results found for: LDLCALC No results found for: TRIG No results found for: CHOLHDL No results found for: LDLDIRECT       Studies: No results found.  ASSESSMENT AND PLAN:  1. Chest tightness with h/o pericarditis with effusion and window: She is not currently experiencing a flare. She has been prescribed prednisone in the past by her PCP. In the future, I would consider colchicine and NSAIDs. I will obtain a cardiac  MRI to evaluate cardiac structure and function with a focus on her pericardium. Will d/c HCTZ.  Dispo: f/u 2 months.   Signed: Kate Sable, M.D., F.A.C.C.  05/18/2015, 9:03 AM

## 2015-05-31 ENCOUNTER — Encounter: Payer: Self-pay | Admitting: Cardiovascular Disease

## 2015-05-31 ENCOUNTER — Telehealth: Payer: Self-pay | Admitting: Cardiovascular Disease

## 2015-05-31 NOTE — Telephone Encounter (Signed)
Called patient and gave her date and time of cardiac MRI and letter mailed to the patient.

## 2015-06-11 ENCOUNTER — Ambulatory Visit (HOSPITAL_COMMUNITY)
Admission: RE | Admit: 2015-06-11 | Discharge: 2015-06-11 | Disposition: A | Discharge status: Home / Self Care | Payer: BC Managed Care – PPO | Source: Ambulatory Visit | Attending: Cardiovascular Disease | Admitting: Cardiovascular Disease

## 2015-06-11 DIAGNOSIS — I309 Acute pericarditis, unspecified: Secondary | ICD-10-CM

## 2015-06-11 LAB — CREATININE, SERUM
CREATININE: 0.8 mg/dL (ref 0.44–1.00)
GFR calc non Af Amer: >60 mL/min (ref >60)

## 2015-06-11 MED ORDER — GADOBENATE DIMEGLUMINE 529 MG/ML IV SOLN
20.0000 mL | Freq: Once | INTRAVENOUS | Status: AC
  Administered 2015-06-11: 20 mL via INTRAVENOUS

## 2015-06-20 ENCOUNTER — Ambulatory Visit
Admission: RE | Admit: 2015-06-20 | Payer: BC Managed Care – PPO | Source: Ambulatory Visit | Admitting: Radiation Oncology

## 2015-06-21 ENCOUNTER — Telehealth: Payer: Self-pay | Admitting: Oncology

## 2015-06-21 NOTE — Telephone Encounter (Signed)
returned call and s.w. pt and r.s appt per pt request....pt ok and aware °

## 2015-06-22 ENCOUNTER — Ambulatory Visit: Payer: BC Managed Care – PPO | Admitting: Nurse Practitioner

## 2015-06-27 ENCOUNTER — Ambulatory Visit
Admission: RE | Admit: 2015-06-27 | Discharge: 2015-06-27 | Disposition: A | Discharge status: Home / Self Care | Payer: BC Managed Care – PPO | Source: Ambulatory Visit | Attending: Radiation Oncology | Admitting: Radiation Oncology

## 2015-06-27 ENCOUNTER — Encounter: Payer: Self-pay | Admitting: Radiation Oncology

## 2015-06-27 VITALS — BP 125/74 | HR 68 | Temp 98.0°F | Ht 63.0 in | Wt 124.7 lb

## 2015-06-27 DIAGNOSIS — K624 Stenosis of anus and rectum: Secondary | ICD-10-CM

## 2015-06-27 DIAGNOSIS — C211 Malignant neoplasm of anal canal: Secondary | ICD-10-CM

## 2015-06-27 NOTE — Progress Notes (Signed)
[name] here for reassessment s/p XRT for anal cancer.  She denies any rectal pain and denies any proctitis,loose stools, nor difficulty defecating.  Reports that dr. Watt Climes "stretched" the rectal area to facilitate passage.   Denies any fatigue.  BP 125/74 mmHg  Pulse 68  Temp(Src) 98 F (36.7 C)  Ht 5\' 3"  (1.6 m)  Wt 124 lb 11.2 oz (56.564 kg)  BMI 22.10 kg/m2   Wt Readings from Last 3 Encounters:  06/27/15 124 lb 11.2 oz (56.564 kg)  05/18/15 110 lb (49.896 kg)  04/26/15 121 lb 12.8 oz (55.248 kg)

## 2015-06-27 NOTE — Progress Notes (Signed)
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  Narrative:  The patient returns today for routine follow-up. 6 she reports that in November 2016 she was seen by Dr. Watt Climes and had dilation of the anal canal.   On review of systems, the patient reports that overall she is doing quite well. She states that she's been doing well also in terms of bowel function since having her procedure with Dr. Watt Climes., and questions whether or not she should have  a colonoscopy this year. She states it has been about 2 years since she's having this performed. She denies any difficulty with bowel function and specifically denies any rectal bleeding, hematochezia or melena. She denies any pain with bowel movements at this time. She noted symptoms itching and burning of the vulva and vagina and was concerned about a yeast infection which she treated with over-the-counter Monistat. Since that time her symptoms have or resolved. She denies any vaginal bleeding or discharge. She is not experiencing any vulvar or perineal itching. She denies any chest pain, shortness of breath, fevers or chills. She is not experiencing any abdominal pain, nausea or vomiting. She denies any unintended weight changes or new onset of hot flashes. A complete review of systems is obtained and is otherwise negative.   Past Medical History:  Past Medical History  Diagnosis Date  . Perimenopausal   . Myocarditis, unspecified ([place])   . Unspecified disease of pericardium   . Pericardial effusion   .  Pericarditis   . Headache(784.0)   . Plantar fasciitis of right foot   . Allergic sinusitis   . Pericarditis     history 2006 from virus  . Cancer ([place]) 01/21/13    rectum  . Hx of radiation therapy 02/21/13-04/04/13    rectal 50.4Gy/20fx    Past Surgical History:  Past Surgical History  Procedure Laterality Date  . Pericardial window on november 11,2006, by dr bartle    . Other surgical history      post hysterectomy 1991  . Abdominal hysterectomy    . Colonoscopy    . Orif clavicular fracture  07/15/2011    Procedure: OPEN REDUCTION INTERNAL FIXATION (ORIF) CLAVICULAR FRACTURE;  Surgeon: Nita Sells, MD;  Location: [facility];  Service: Orthopedics;  Laterality: Right;   Social History:  Social History   Social History Narrative   She lives in [place] with her husband. She is a retired Patent examiner. She denies any  Tobacco, alcohol, or drug use. She does not routinely exercise. She and her husband may relocate to [facility] in the next couple of years. She is interested however maintaining her health care for her cancer care here in [place].    Family History:  Family History  Problem Relation Age of Onset  . Coronary artery disease Sister     stented at age 77  . Cancer Sister 29    melanoma  . Cancer Father     bladder  . Cancer Maternal Aunt  68    pancreas  . Cancer Paternal Aunt 45    breast cancer    Health Maintenance: The patient believes that her last colonoscopy was about 2 years ago. She states that she has never had any polyps. Her last Pap smear was prior to her hysterectomy many years ago for endometriosis. She's never had any abnormal Pap smears she is due to have mammography, and is planning to go for this in May of this year.                     ALLERGIES:  is allergic to ciprofloxacin and penicillins.  Meds: Current Outpatient Prescriptions  Medication Sig Dispense Refill  . Cholecalciferol 10000 UNITS CAPS  Take 1,000 Units by mouth daily.    . Cyanocobalamin 1500 MCG TBDP Take 1,500 mcg by mouth daily.    Marland Kitchen estradiol (ESTRACE) 1 MG tablet     . predniSONE (DELTASONE) 20 MG tablet 3 tablets a day for 3 days, then 2 tablets a day for 3 days, then one tablet a day for 2 days (Patient taking differently: 3 tablets a day for 3 days, then 2 tablets a day for 3 days, then one tablet a day for 2 days. Now taking as needed) 17 tablet 0  . PROCTOZONE-HC 2.5 % rectal cream Place 1 application rectally as needed.     . SUMAtriptan (IMITREX) 25 MG tablet Take 25 mg by mouth as needed for migraine.     . Omega-3 Fatty Acids (FISH OIL BURP-LESS PO) Take 1 tablet by mouth daily. Reported on 06/27/2015     No current facility-administered medications for this encounter.    Physical Findings:  height is 5\' 3"  (1.6 m) and weight is 124 lb 11.2 oz (56.564 kg). Her temperature is 98 F (36.7 C). Her blood pressure is 125/74 and her pulse is 68.   Pain scale 0/10  In general this is a well appearing caucasian female in no acute distress. She is alert and oriented x4 and appropriate throughout the examination. HEENT reveals that the patient is normocephalic, atraumatic. EOMs are intact. PERRLA. Skin is intact without any evidence of gross lesions. Cardiovascular exam reveals a regular rate and rhythm, no clicks rubs or murmurs are auscultated. Chest is clear to auscultation bilaterally. Lymphatic assessment is performed and does not reveal any adenopathy in the cervical, supraclavicular, axillary, or inguinal chains. Abdomen has active bowel sounds in all quadrants and is intact. The abdomen is soft, non tender, non distended. Lower extremities are negative for pretibial pitting edema, deep calf tenderness, cyanosis or clubbing. pelvic exam reveals normal-appearing external female genitalia, labial perineal appearing tissue. No lesions are seen grossly. The uterus is normal in appearance. Speculum exam reveals a well-healed  vaginal cuff consistent with her previous hysterectomy. No visible lesions or bleeding is identified. Bimanual exam reveals nonpalpable adnexa, and no palpable masses. Rectovaginal exam is negative for induration of the septum, anterior or posterior rectum, or anal canal, approximately 1 cm from the anal verge there is circumferential stenosis, I can palpate past this, and there is no palpable thickening or tumor.     Lab Findings: Lab Results  Component Value Date   WBC 3.0* 10/05/2013   HGB 12.2 10/05/2013   HCT 37.3 10/05/2013   MCV 91.2 10/05/2013   PLT 197 10/05/2013   Radiographic Findings: Mr Card Morphology Wo/w Cm  06/11/2015  CLINICAL DATA:  Pericarditis EXAM: CARDIAC MRI TECHNIQUE: The patient was scanned on a  1.5 Tesla GE magnet. A dedicated cardiac coil was used. Functional imaging was done using Fiesta sequences. 2,3, and 4 chamber views were done to assess for RWMA's. Modified Simpson's rule using a short axis stack was used to calculate an ejection fraction on a dedicated work Conservation officer, nature. The patient received 20 cc of Multihance. After 10 minutes inversion recovery sequences were used to assess for infiltration and scar tissue. CONTRAST:  20 cc Multihance FINDINGS: All 4 cardiac chambers were normal in size and function. There was no ASD/VSD. There was no pericardial effusion. The AV, MV and TV were structurally normal. The aortic root was normal. The RV was normal in size and function. The LV was normal with no RWMAls The quantitative EF was 72% (EDV 77 cc, ESV 22 cc SV 56 cc) Delayed enhancement images using gadolinium were Normal with no scar, infiltration or signs of myocarditis Prominent epicardial fat. IMPRESSION: 1) No perciardial effusion or signs of pericarditis 2) Normal LV size and function EF 72% 3) No delayed gadolinium enhancement or signs of myocarditis 4) Normal cardiac chambers 5) Normal cardiac valves Jenkins Rouge Electronically Signed   By: Jenkins Rouge M.D.   On: 06/11/2015 13:08   Impression/Plan:  1. 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 The patient is in agreement with this. She is counseled on the importance of being seen by Dr. Watt Climes 4 either anoscopy or sigmoidoscopy evaluation between every 6-12 months for the next year. We also reviewed the NCCN guidelines do not included routine imaging studies for the stage of her cancer. 2. Healthcare maintenance. The patient is counseled on the rationale for reestablishing her colonoscopy scheduled. She's encouraged to contact Dr. Tammi Klippel to find out if he feels that she is this year, or if she can postpone this. She also is due for mammography, and will be having this performed next month. She's encouraged to keep [place] informed any questions or concerns regarding other healthcare follow-up.     Carola Rhine, PAC  This document serves as a record of services personally performed by Shona Simpson, PA. It was created on her behalf by Jenell Milliner, a trained medical scribe. The creation of this record is based on the scribe's personal observations and the provider's statements to them. This document has been checked and approved by the attending provider.

## 2015-06-28 ENCOUNTER — Encounter: Payer: Self-pay | Admitting: Radiation Oncology

## 2015-06-28 DIAGNOSIS — K624 Stenosis of anus and rectum: Secondary | ICD-10-CM | POA: Insufficient documentation

## 2015-07-04 ENCOUNTER — Other Ambulatory Visit: Payer: Self-pay | Admitting: Nurse Practitioner

## 2015-07-04 ENCOUNTER — Telehealth: Payer: Self-pay | Admitting: Oncology

## 2015-07-04 NOTE — Telephone Encounter (Signed)
spoke w/ pt confrimed oct appt

## 2015-07-05 ENCOUNTER — Ambulatory Visit: Payer: BC Managed Care – PPO | Admitting: Nurse Practitioner

## 2015-07-18 ENCOUNTER — Ambulatory Visit (INDEPENDENT_AMBULATORY_CARE_PROVIDER_SITE_OTHER): Payer: BC Managed Care – PPO | Admitting: Cardiovascular Disease

## 2015-07-18 ENCOUNTER — Encounter: Payer: Self-pay | Admitting: Cardiovascular Disease

## 2015-07-18 VITALS — BP 130/78 | HR 76 | Ht 63.0 in | Wt 125.0 lb

## 2015-07-18 DIAGNOSIS — R079 Chest pain, unspecified: Secondary | ICD-10-CM | POA: Diagnosis not present

## 2015-07-18 DIAGNOSIS — I319 Disease of pericardium, unspecified: Secondary | ICD-10-CM | POA: Diagnosis not present

## 2015-07-18 MED ORDER — PREDNISONE 5 MG PO TABS: ORAL_TABLET | ORAL | Status: DC

## 2015-07-18 NOTE — Patient Instructions (Signed)
Your physician wants you to follow-up in: 1 year with Dr Virgina Jock will receive a reminder letter in the mail two months in advance. If you don't receive a letter, please call our office to schedule the follow-up appointment.   Take prednisone 5 mg daily if pericarditis flares up.Please call us immediately if this occurs        If you need a refill on your cardiac medications before your next appointment, please call your pharmacy.      Thank you for choosing [facility] !

## 2015-07-18 NOTE — Progress Notes (Signed)
Patient ID: [name], female   DOB: 11/01/1950, 65 y.o.   MRN: [id number]      SUBJECTIVE: The patient returns for follow-up after undergoing cardiovascular testing performed for the evaluation of pericarditis.  Cardiac MRI revealed no pericardial effusion or signs of pericarditis, normal left ventricular size and function, EF 72%, no signs of myocarditis, and normal cardiac chambers and valves.  She has been feeling well and denies chest pain and shortness of breath. She and her husband are moving to [facility] in the near future and she requests a supply of prednisone in the event she develops a flare.  Review of Systems: As per "subjective", otherwise negative.  Allergies  Allergen Reactions  . Ciprofloxacin Anaphylaxis    Swelling to throat, face, lips  . Penicillins Itching and Rash    Current Outpatient Prescriptions  Medication Sig Dispense Refill  . Cholecalciferol 10000 UNITS CAPS Take 1,000 Units by mouth daily.    . Cyanocobalamin 1500 MCG TBDP Take 1,500 mcg by mouth daily.    Marland Kitchen estradiol (ESTRACE) 1 MG tablet     . Omega-3 Fatty Acids (FISH OIL BURP-LESS PO) Take 1 tablet by mouth daily. Reported on 06/27/2015    . PROCTOZONE-HC 2.5 % rectal cream Place 1 application rectally as needed.     . SUMAtriptan (IMITREX) 25 MG tablet Take 25 mg by mouth as needed for migraine.      No current facility-administered medications for this visit.    Past Medical History  Diagnosis Date  . Perimenopausal   . Myocarditis, unspecified ([place])   . Unspecified disease of pericardium   . Pericardial effusion   . Pericarditis   . Headache(784.0)   . Plantar fasciitis of right foot   . Allergic sinusitis   . Pericarditis     history 2006 from virus  . Cancer ([place]) 01/21/13    rectum  . Hx of radiation therapy 02/21/13-04/04/13    rectal 50.4Gy/36fx    Past Surgical History  Procedure Laterality Date  . Pericardial window on november 11,2006, by dr bartle    . Other  surgical history      post hysterectomy 1991  . Abdominal hysterectomy    . Colonoscopy    . Orif clavicular fracture  07/15/2011    Procedure: OPEN REDUCTION INTERNAL FIXATION (ORIF) CLAVICULAR FRACTURE;  Surgeon: Nita Sells, MD;  Location: [facility];  Service: Orthopedics;  Laterality: Right;    Social History   Social History  . Marital Status: Married    Spouse Name: N/A  . Number of Children: N/A  . Years of Education: N/A   Occupational History  . Not on file.   Social History Main Topics  . Smoking status: Never Smoker   . Smokeless tobacco: Not on file  . Alcohol Use: Yes     Comment: occasional  . Drug Use: No  . Sexual Activity: Yes   Other Topics Concern  . Not on file   Social History Narrative   She lives in [place] with her husband. She is a retired Patent examiner. She denies any  Tobacco, alcohol, or drug use. She does not routinely exercise. She and her husband may relocate to [facility] in the next couple of years. She is interested however maintaining her health care for her cancer care here in [place].     Filed Vitals:   07/18/15 0823  BP: 130/78  Pulse: 76  Height: 5\' 3"  (1.6 m)  Weight: 125 lb (56.7 kg)  SpO2: 99%    PHYSICAL EXAM General: NAD HEENT: Normal. Neck: No JVD, no thyromegaly. Lungs: Clear to auscultation bilaterally with normal respiratory effort. CV: Nondisplaced PMI.  Regular rate and rhythm, normal S1/S2, no S3/S4, no murmur. No pretibial or periankle edema.  No carotid bruit.   Abdomen: Soft, nontender, no distention.  Neurologic: Alert and oriented.  Psych: Normal affect. Skin: Normal. Musculoskeletal: No gross deformities.  ECG: Most recent ECG reviewed.      ASSESSMENT AND PLAN: 1. Chest tightness with h/o pericarditis with effusion and window: She is not currently experiencing a flare. I will prescribe a supply of 5 mg tablets of prednisone in the event she has a flare. In  the future, I may consider colchicine and NSAIDs. Cardiac MRI was normal.  Dispo: f/u 1 year.  Kate Sable, M.D., F.A.C.C.

## 2015-07-19 ENCOUNTER — Ambulatory Visit: Payer: BC Managed Care – PPO | Admitting: Cardiovascular Disease

## 2015-12-24 ENCOUNTER — Ambulatory Visit (HOSPITAL_BASED_OUTPATIENT_CLINIC_OR_DEPARTMENT_OTHER): Payer: BC Managed Care – PPO | Admitting: Oncology

## 2015-12-24 ENCOUNTER — Telehealth: Payer: Self-pay | Admitting: *Deleted

## 2015-12-24 ENCOUNTER — Telehealth: Payer: Self-pay | Admitting: Oncology

## 2015-12-24 VITALS — BP 126/67 | HR 76 | Temp 98.0°F | Resp 17 | Ht 63.0 in | Wt 124.2 lb

## 2015-12-24 DIAGNOSIS — Z85048 Personal history of other malignant neoplasm of rectum, rectosigmoid junction, and anus: Secondary | ICD-10-CM | POA: Diagnosis not present

## 2015-12-24 DIAGNOSIS — M545 Low back pain: Secondary | ICD-10-CM | POA: Diagnosis not present

## 2015-12-24 DIAGNOSIS — C211 Malignant neoplasm of anal canal: Secondary | ICD-10-CM

## 2015-12-24 NOTE — Progress Notes (Signed)
  [place] OFFICE PROGRESS NOTE   Diagnosis: Anal cancer  INTERVAL HISTORY:   Ms. [name] returns as scheduled. She underwent a sigmoidoscopy by Dr.Magod on 07/24/2015 . An anal stricture was confirmed on digital exam. No other abnormality. She is scheduled for a repeat flexible sigmoidoscopy at a six-month interval.  She feels well. No difficulty with bowel function. No bleeding. [place] She continues to have intermittent back pain. No consistent pain.    Objective:  Vital signs in last 24 hours:  Blood pressure 126/67, pulse 76, temperature 98 F (36.7 C), temperature source Oral, resp. rate 17, height 5\' 3"  (1.6 m), weight 124 lb 3.2 oz (56.3 kg), SpO2 100 %.    HEENT: Neck without mass Lymphatics: No cervical, supraclavicular, axillary, or inguinal nodes Resp: Lungs clear bilaterally Cardio: Regular rate and rhythm GI: No hepatosplenomegaly, no mass, nontender, rectal exam deferred secondary to the planned sigmoidoscopy next month Vascular: No leg edema    Medications: I have reviewed the patient's current medications.  Assessment/Plan: 1. 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  Staging CT of the abdomen/pelvis 01/24/2013 revealed small perirectal lymph nodes and a 6 mm left internal iliac node.   Radiation and cycle 1 5-FU/mitomycin C. initiated on 02/21/2013.   Cycle 2 5-FU/mitomycin-C 03/28/2013, radiation completed 04/04/2013.  Flexible sigmoidoscopy 01/17/2014. The anus, rectum, rectosigmoid colon, sigmoid colon, descending colon and distal transverse colon were normal. Repeat flexible sigmoidoscopy planned in one year for screening purposes. 2. Pericarditis 2006. 3. History of thrombocytopenia. Question ITP. 4. Status post TAH/BSO. 5. Family history multiple cancers. 6. History of mucositis secondary to chemotherapy. 7. History of Pancytopenia  secondary chemotherapy.  8. 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 The dysuria has resolved. 9. Rash and skin breakdown at the perineum/groin-resolved. 10. Dehydration 04/07/2013. She received intravenous fluids. 11. Nausea/vomiting and diarrhea following cycle 2 5-FU and mitomycin C. 12. Allergic reaction to ciprofloxacin 04/07/2012 with "mouth tingling and swelling". Evaluated in the emergency Department.   Disposition:  [name] remains in clinical remission from anal cancer. She will schedule a sigmoidoscopy with Dr.  Watt Climes for next month.  She will be scheduled for a radiation oncology appointment in 6 months. She will return  to medical oncology in one year.  She will seek medical attention for consistent back pain.  Betsy Coder, MD  12/24/2015  1:05 PM

## 2015-12-24 NOTE — Telephone Encounter (Signed)
"  What time is my appointment today?"  Advised to come in at 12:00 to register for today's 12:30 appointment.

## 2015-12-24 NOTE — Telephone Encounter (Signed)
Avs report and appointment schedule given to patient, per 12/24/15 los °

## 2016-04-04 ENCOUNTER — Other Ambulatory Visit: Payer: Self-pay | Admitting: Nurse Practitioner

## 2016-04-25 IMAGING — MR MR CARD MORPHOLOGY WO/W CM
9 of 10 series · 15 of 16 positions shown · IV contrast (25    Multihance)
Comparison: none

CLINICAL DATA: Pericarditis

EXAM:
CARDIAC MRI
TECHNIQUE: The patient was scanned on a 1.5 Tesla GE magnet. A dedicated
cardiac coil was used. Functional imaging was done using Fiesta
sequences. [DATE], and 4 chamber views were done to assess for RWMA's.
Modified Aujla rule using a short axis stack was used to
calculate an ejection fraction on a dedicated work station using
Circle software. The patient received 20 cc of Multihance. After 10
minutes inversion recovery sequences were used to assess for
infiltration and scar tissue.
CONTRAST:  20 cc Multihance

[Series 3: bSSFP · sagittal · 8.0mm · 1.25mm/px · 1 of 14 slices shown (1 of 4)]
[im 1/14]
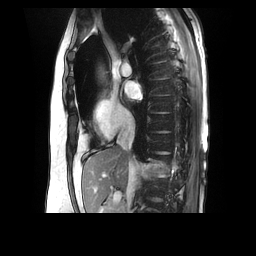

[Series 4: bSSFP · oblique · 8.0mm · 1.25mm/px · 1 of 20 slices shown (2 of 4)]
[im 1/20]
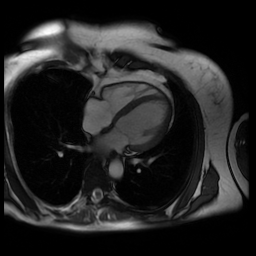

[Series 5: bSSFP · oblique · 8.0mm · 1.37mm/px · 7 of 300 slices shown (3 of 4)]
[im 1/300]
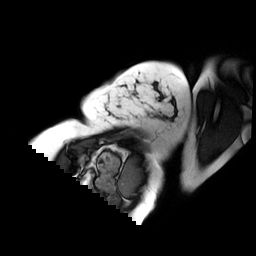
[im 50/300]
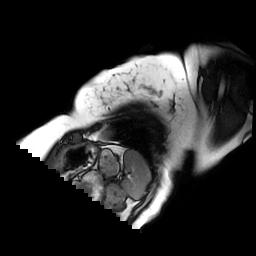
[im 100/300]
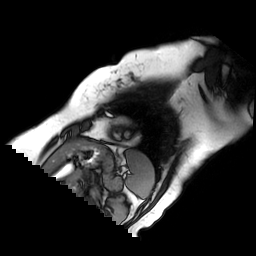
[im 150/300]
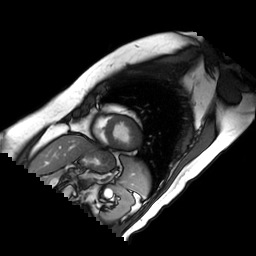
[im 200/300]
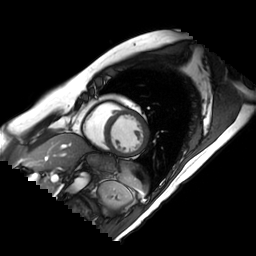
[im 250/300]
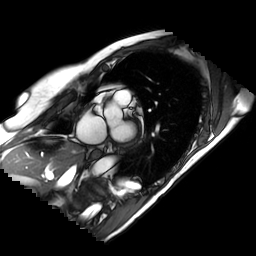
[im 300/300]
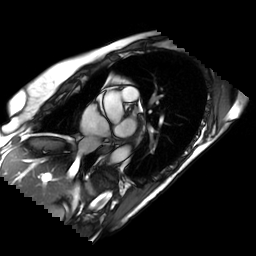

[Series 6: bSSFP · oblique · 8.0mm · 1.21mm/px · 1 of 60 slices shown (4 of 4)]
[im 1/60]
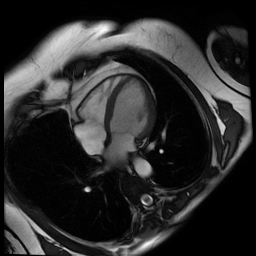

[Series 7: T2 · oblique · 8.0mm · 1.41mm/px · 1 of 60 slices shown]
[im 1/60]
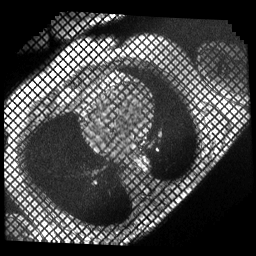

[Series 8: cine ir · oblique · 8.0mm · 1.48mm/px · 1 of 30 slices shown]
[im 1/30]
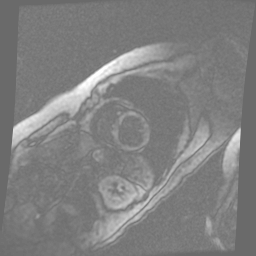

[Series 11: delayed ir prep · oblique · 8.0mm · 1.41mm/px · 1 of 15 slices shown]
[im 1/15]
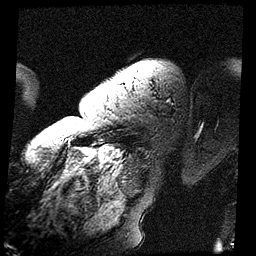

[Series 12: rad delayed ir · oblique · 8.0mm · 1.37mm/px · 1 of 3 slices shown (1 of 2)]
[im 1/3]
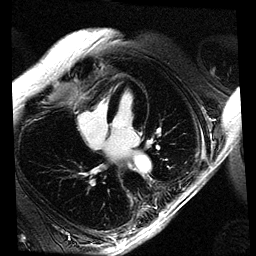

[Series 14: rad delayed ir · oblique · 8.0mm · 1.37mm/px · 1 of 3 slices shown (2 of 2)]
[im 1/3]
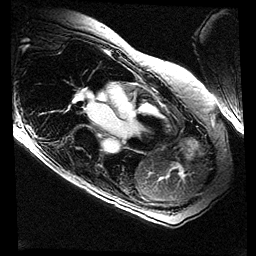

[15 of 16 positions shown; findings below may reference images not displayed]

FINDINGS: All 4 cardiac chambers were normal in size and function. There was
no ASD/VSD. There was no pericardial effusion. The AV, MV and TV
were structurally normal. The aortic root was normal.

The RV was normal in size and function. The LV was normal with no
RWMAls The quantitative EF was 72% (EDV 77 cc, ESV 22 cc SV 56 cc)
Delayed enhancement images using gadolinium were

Normal with no scar, infiltration or signs of myocarditis Prominent
epicardial fat.
IMPRESSION: 1) No perciardial effusion or signs of pericarditis

2) Normal LV size and function EF 72%

3) No delayed gadolinium enhancement or signs of myocarditis

4) Normal cardiac chambers

5) Normal cardiac valves

Lavonne Iyengar

## 2016-06-26 NOTE — Progress Notes (Addendum)
[name] 66 y.o. woman with Squamous cell carcinoma of the proximal anal canal/distal rectum radiation complete 04-04-13 one year FU.  Pain:None Nausea/ Vomiting:None Diarrhea:Reports normal bowel movements everyday without rectal bleeding.  Had a normal flexscope 02-2016 Dr. Lizbeth Bark Skin irritation:None Fatigue:Able to do her daily routine without difficulty. Loss of appetite:Good eating three meals daily and snacking. Weight: Wt Readings from Last 3 Encounters:  07/01/16 125 lb 3.2 oz (56.8 kg)  12/24/15 124 lb 3.2 oz (56.3 kg)  07/18/15 125 lb (56.7 kg)  BP 137/75   Pulse 68   Temp 97.8 F (36.6 C) (Oral)   Resp 16   Ht 5\' 3"  (1.6 m)   Wt 125 lb 3.2 oz (56.8 kg)   SpO2 100%   BMI 22.18 kg/m

## 2016-07-01 ENCOUNTER — Encounter: Payer: Self-pay | Admitting: Radiation Oncology

## 2016-07-01 ENCOUNTER — Ambulatory Visit
Admission: RE | Admit: 2016-07-01 | Discharge: 2016-07-01 | Disposition: A | Discharge status: Home / Self Care | Payer: BC Managed Care – PPO | Source: Ambulatory Visit | Attending: Radiation Oncology | Admitting: Radiation Oncology

## 2016-07-01 VITALS — BP 137/75 | HR 68 | Temp 97.8°F | Resp 16 | Ht 63.0 in | Wt 125.2 lb

## 2016-07-01 DIAGNOSIS — Z881 Allergy status to other antibiotic agents status: Secondary | ICD-10-CM | POA: Diagnosis not present

## 2016-07-01 DIAGNOSIS — Z85048 Personal history of other malignant neoplasm of rectum, rectosigmoid junction, and anus: Secondary | ICD-10-CM | POA: Diagnosis present

## 2016-07-01 DIAGNOSIS — K624 Stenosis of anus and rectum: Secondary | ICD-10-CM | POA: Diagnosis not present

## 2016-07-01 DIAGNOSIS — G43909 Migraine, unspecified, not intractable, without status migrainosus: Secondary | ICD-10-CM

## 2016-07-01 DIAGNOSIS — Z8052 Family history of malignant neoplasm of bladder: Secondary | ICD-10-CM | POA: Diagnosis not present

## 2016-07-01 DIAGNOSIS — Z90710 Acquired absence of both cervix and uterus: Secondary | ICD-10-CM | POA: Insufficient documentation

## 2016-07-01 DIAGNOSIS — Z780 Asymptomatic menopausal state: Secondary | ICD-10-CM

## 2016-07-01 DIAGNOSIS — Z923 Personal history of irradiation: Secondary | ICD-10-CM | POA: Insufficient documentation

## 2016-07-01 DIAGNOSIS — I891 Lymphangitis: Secondary | ICD-10-CM

## 2016-07-01 DIAGNOSIS — Z880 Allergy status to penicillin: Secondary | ICD-10-CM | POA: Insufficient documentation

## 2016-07-01 DIAGNOSIS — Z79899 Other long term (current) drug therapy: Secondary | ICD-10-CM | POA: Diagnosis not present

## 2016-07-01 DIAGNOSIS — I10 Essential (primary) hypertension: Secondary | ICD-10-CM | POA: Diagnosis not present

## 2016-07-01 DIAGNOSIS — Z803 Family history of malignant neoplasm of breast: Secondary | ICD-10-CM | POA: Insufficient documentation

## 2016-07-01 DIAGNOSIS — C211 Malignant neoplasm of anal canal: Secondary | ICD-10-CM

## 2016-07-01 MED ORDER — METOPROLOL TARTRATE 25 MG PO TABS: 12.5000 mg | ORAL_TABLET | Freq: Every morning | ORAL | 6 refills | Status: DC

## 2016-07-01 MED ORDER — ESTRADIOL 1 MG PO TABS: 1.0000 mg | ORAL_TABLET | Freq: Every day | ORAL | 12 refills | Status: DC

## 2016-07-01 MED ORDER — SUMATRIPTAN SUCCINATE 25 MG PO TABS: 25.0000 mg | ORAL_TABLET | ORAL | 6 refills | Status: DC | PRN

## 2016-07-01 MED ORDER — HYDROCORTISONE 2.5 % RE CREA: 1.0000 "application " | TOPICAL_CREAM | RECTAL | 6 refills | Status: DC | PRN

## 2016-07-01 NOTE — Progress Notes (Signed)
Radiation Oncology         ([phone] ________________________________  Name: [name] MRN: [id number]  Date: 07/01/2016  DOB: 11/05/1950  Follow-Up Visit Note  CC: Angel Hillier, MD  Angel Essex, MD  Diagnosis: Squamous cell carcinoma of the proximal anal canal/distal rectum         Interval Since Last Radiation: 3 years, 3 months  02/21/2013 - 04/04/2013: 50.4 Gy in 28 fractions to the anal canal and pelvic nodes  Narrative: [name] is a very pleasant 66 y.o. 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 She was originally diagnosed with T2 N0 disease, and completed concurrent chemotherapy with radiation. She has done well and has been clinically without evidence of disease since she finished treatment in 2015. She's been followed in surveillance by radiation oncology, medical oncology, and GI. Dr. Watt Climes has been following the patient regularly with anoscopy ,he saw her in December 2017. At that time dilation of the rectum was also performed due to chronic stenosis. She was also seen by Dr. Benay Spice and medical oncology in October 2017, and comes today for follow-up in our clinic. That it was admitted for what he has greatly  On review of systems, the patient reports that she is doing well overall. She denies any chest pain, shortness of breath, cough, fevers, chills, night sweats, unintended weight changes. She denies any bladder disturbances, and denies abdominal pain, nausea or vomiting. 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 She denies rectal bleeding currently, but occasionally nodes spotting after a bowel movement. Occasionally she notes pain with bowel movements. She denies any new musculoskeletal or joint aches or pains, new skin lesions or concerns. A complete review of systems is obtained and is otherwise negative.    Past Medical History:  Past Medical History:    Diagnosis Date  . Allergic sinusitis   . Cancer ([place]) 01/21/13   rectum  . Headache(784.0)   . Hx of radiation therapy 02/21/13-04/04/13   rectal 50.4Gy/35fx  . Myocarditis, unspecified ([place])   . Pericardial effusion   . Pericarditis   . Pericarditis    history 2006 from virus  . Perimenopausal   . Plantar fasciitis of right foot   . Unspecified disease of pericardium     Past Surgical History:  Past Surgical History:  Procedure Laterality Date  . ABDOMINAL HYSTERECTOMY    . COLONOSCOPY    . ORIF CLAVICULAR FRACTURE  07/15/2011   Procedure: OPEN REDUCTION INTERNAL FIXATION (ORIF) CLAVICULAR FRACTURE;  Surgeon: Nita Sells, MD;  Location: [employer];  Service: Orthopedics;  Laterality: Right;  . OTHER SURGICAL HISTORY     post hysterectomy 1991  . pericardial window on November 11,2006, by Dr Cyndia Bent     Social History:  Social History   Social History Narrative   She is from [place] but currently lives in [place] with her husband. She is a retired Patent examiner. She denies any Tobacco, alcohol, or drug use. She does not routinely exercise. S She is interested however maintaining her health care for her cancer care here in [place].    Family History:  Family History  Problem Relation Age of Onset  . Coronary artery disease Sister     stented at age 54  . Cancer Sister 11    melanoma  . Cancer Father     bladder  . Cancer  Maternal Aunt 68    pancreas  . Cancer Paternal Aunt 84    breast cancer    Health Maintenance: The patient had colonoscopy in December 2017. She states that she has never had any polyps. Her last Pap smear was prior to her hysterectomy many years ago for endometriosis. She's never had any abnormal Pap smears she is due to have mammography.                    ALLERGIES:  is allergic to ciprofloxacin and penicillins.  Meds: Current Outpatient Prescriptions  Medication Sig Dispense Refill  . estradiol  (ESTRACE) 1 MG tablet Take 1 tablet (1 mg total) by mouth daily. 30 tablet 12  . hydrocortisone (PROCTOZONE-HC) 2.5 % rectal cream Place 1 application rectally as needed. 30 g 6  . metoprolol tartrate (LOPRESSOR) 25 MG tablet Take 0.5 tablets (12.5 mg total) by mouth every morning. 30 tablet 6  . predniSONE (DELTASONE) 5 MG tablet Take 5 mg daily prn for pericarditis flare up 30 tablet 3  . Cholecalciferol 10000 UNITS CAPS Take 1,000 Units by mouth daily.    . clindamycin (CLEOCIN) 300 MG capsule Take 1 capsule (300 mg total) by mouth 3 (three) times daily. 30 capsule 0  . Cyanocobalamin 1500 MCG TBDP Take 1,500 mcg by mouth daily.    . Omega-3 Fatty Acids (FISH OIL BURP-LESS PO) Take 1 tablet by mouth daily. Reported on 06/27/2015    . SUMAtriptan (IMITREX) 25 MG tablet Take 1 tablet (25 mg total) by mouth as needed for migraine. 10 tablet 6   No current facility-administered medications for this encounter.     Physical Findings:  height is 5\' 3"  (1.6 m) and weight is 125 lb 3.2 oz (56.8 kg). Her oral temperature is 97.8 F (36.6 C). Her blood pressure is 137/75 and her pulse is 68. Her respiration is 16 and oxygen saturation is 100%.   Pain scale 0/10  In general this is a well appearing caucasian female in no acute distress. She is alert and oriented x4 and appropriate throughout the examination. HEENT reveals that the patient is normocephalic, atraumatic. EOMs are intact. PERRLA. Skin is intact without any evidence of gross lesions. Cardiovascular exam reveals a regular rate and rhythm, no clicks rubs or murmurs are auscultated. Chest is clear to auscultation bilaterally. Lymphatic assessment is performed and does not reveal any adenopathy in the cervical, supraclavicular, axillary, or inguinal chains. Bilateral breast exam is performed and reveals  Abdomen has active bowel sounds in all quadrants and is intact. The abdomen is soft, non tender, non distended. Lower extremities are negative for  pretibial pitting edema, deep calf tenderness, cyanosis or clubbing. pelvic exam reveals normal-appearing external female genitalia, labial perineal appearing tissue. No lesions are seen grossly. The uterus is normal in appearance. Speculum exam reveals a well-healed vaginal cuff consistent with her previous hysterectomy. No visible lesions or bleeding is identified. Bimanual exam reveals nonpalpable adnexa, and no palpable masses there is scarring at the posterior fornix along the left. Rectovaginal exam is negative for induration of the septum, anterior or posterior rectum, or anal canal, approximately 1 cm from the anal verge there is circumferential stenosis, I can palpate past this, and there is no palpable thickening or tumor. This exam is unchanged from previous. Along the medial thighs, she has erythema which is warm to the touch, and 1+ pitting edema bilaterally concerning for possible lymphangitis.    Lab Findings: Lab Results  Component Value  Date   WBC 3.0 (L) 10/05/2013   HGB 12.2 10/05/2013   HCT 37.3 10/05/2013   MCV 91.2 10/05/2013   PLT 197 10/05/2013   Radiographic Findings: No results found. Impression/Plan:  1. Stage II (T2,N0,M0) squamous cell carcinoma of the proximal anal canal/distal rectum.The patient appears to be clinically without evidence of disease. We reviewed the NCCN guidelines for surveillance of anal cancer and as she is 3 years out from her treatment, she can discontinue anoscopy per guidelines, however given her stenosis, we would recommend she continue this annually and see Dr. Watt Climes as needed. We will plan to see her back in 1 year's time in our clinic, and she will contact us if she has questions or concerns prior to her next visit. She will determine with Dr. Benay Spice the frequency of being seen, however she may elect to follow up with myself and Dr. Watt Climes, staggering these appointments so that she's still seen every 6 months. I will continue to provide GYN  care for her as well. Although she does not qualify for pap smear screening per guidelines, she still should have annual pelvic exam. 2. Healthcare maintenance. The patient is due for mammography. We will proceed with screening mammogram and follow up with these results when available. She's encouraged to keep [place] informed any questions or concerns regarding other healthcare follow-up. Her medications are also refilled for her HTN, vasomotor symptoms of menopause, and migraine headaches. 3. Vaginal and rectal stenosis. The patient is given medical grade dilators to use for both vaginal and rectal sites. We will follow this expectantly and I've encouraged her to reach out to Dr. Watt Climes if she does not feel that the use of her dilators.  4. Probable lymphangitis. The patient is counseled on the findings of her inner thighs and counseled on the use of antibiotic therapy. As she has an allergy to penicillin, I've given her a prescription for clindamycin. She will keep [place] informed of questions or concerns, however the side effect profile of the medication was detailed with the patient. If lymphedema persists, or this becomes a recurring problem, she will return for further evaluation and be set up with lymphedema clinic closer to home.   In a visit lasting 45 minutes, greater than 50% of the time was spent face to face discussing management of her stenosis, outlining her surveillance schedule, and coordinating the patient's care.     Carola Rhine, PAC

## 2016-07-02 ENCOUNTER — Other Ambulatory Visit: Payer: Self-pay | Admitting: Radiation Oncology

## 2016-07-02 ENCOUNTER — Encounter: Payer: Self-pay | Admitting: Radiation Oncology

## 2016-07-02 DIAGNOSIS — I891 Lymphangitis: Secondary | ICD-10-CM

## 2016-07-02 DIAGNOSIS — Z1239 Encounter for other screening for malignant neoplasm of breast: Secondary | ICD-10-CM

## 2016-07-02 MED ORDER — CLINDAMYCIN HCL 300 MG PO CAPS: 300.0000 mg | ORAL_CAPSULE | Freq: Three times a day (TID) | ORAL | 0 refills | Status: AC

## 2016-07-03 ENCOUNTER — Telehealth: Payer: Self-pay | Admitting: *Deleted

## 2016-07-03 NOTE — Telephone Encounter (Signed)
CALLED PATIENT TO INFORM OF 1 YR. FU WITH ALISON PERKINS ON 07-08-17 @ 1:30 PM, SPOKE WITH PATIENT AND SHE IS AWARE OF THIS APPT.

## 2016-10-15 ENCOUNTER — Other Ambulatory Visit: Payer: Self-pay | Admitting: Radiation Oncology

## 2016-10-15 DIAGNOSIS — Z1231 Encounter for screening mammogram for malignant neoplasm of breast: Secondary | ICD-10-CM

## 2016-12-22 ENCOUNTER — Ambulatory Visit: Payer: BC Managed Care – PPO | Admitting: Nurse Practitioner

## 2016-12-26 ENCOUNTER — Encounter: Payer: Self-pay | Admitting: Family Medicine

## 2017-01-21 ENCOUNTER — Ambulatory Visit: Payer: BC Managed Care – PPO | Admitting: Nurse Practitioner

## 2017-07-01 NOTE — Progress Notes (Addendum)
  [name] 67 y.o. woman with Squamous cell carcinoma of the proximal anal canal/distal rectum radiation complete 04-04-13 one year FU.  Pain:None Nausea/ Vomiting: none Diarrhea: none  flexscope 02-2016 Dr. Lizbeth Bark Skin irritation: none Fatigue:none Loss of appetite:none  Weight:none Vitals:   07/08/17 1336  BP: 131/69  Pulse: 77  Resp: 20  Temp: 98.2 F (36.8 C)  TempSrc: Oral  SpO2: 99%  Weight: 126 lb 8 oz (57.4 kg)

## 2017-07-08 ENCOUNTER — Ambulatory Visit
Admission: RE | Admit: 2017-07-08 | Discharge: 2017-07-08 | Disposition: A | Discharge status: Home / Self Care | Payer: Medicare Other | Source: Ambulatory Visit | Attending: Radiation Oncology | Admitting: Radiation Oncology

## 2017-07-08 ENCOUNTER — Other Ambulatory Visit: Payer: Self-pay

## 2017-07-08 ENCOUNTER — Encounter: Payer: Self-pay | Admitting: Radiation Oncology

## 2017-07-08 VITALS — BP 131/69 | HR 77 | Temp 98.2°F | Resp 20 | Wt 126.5 lb

## 2017-07-08 DIAGNOSIS — I313 Pericardial effusion (noninflammatory): Secondary | ICD-10-CM | POA: Insufficient documentation

## 2017-07-08 DIAGNOSIS — Z8052 Family history of malignant neoplasm of bladder: Secondary | ICD-10-CM | POA: Insufficient documentation

## 2017-07-08 DIAGNOSIS — Z800 Family history of malignant neoplasm of digestive organs: Secondary | ICD-10-CM | POA: Diagnosis not present

## 2017-07-08 DIAGNOSIS — Z9221 Personal history of antineoplastic chemotherapy: Secondary | ICD-10-CM | POA: Insufficient documentation

## 2017-07-08 DIAGNOSIS — K624 Stenosis of anus and rectum: Secondary | ICD-10-CM | POA: Insufficient documentation

## 2017-07-08 DIAGNOSIS — Z79899 Other long term (current) drug therapy: Secondary | ICD-10-CM | POA: Diagnosis not present

## 2017-07-08 DIAGNOSIS — Z803 Family history of malignant neoplasm of breast: Secondary | ICD-10-CM | POA: Insufficient documentation

## 2017-07-08 DIAGNOSIS — Z85048 Personal history of other malignant neoplasm of rectum, rectosigmoid junction, and anus: Secondary | ICD-10-CM | POA: Insufficient documentation

## 2017-07-08 DIAGNOSIS — Z923 Personal history of irradiation: Secondary | ICD-10-CM | POA: Insufficient documentation

## 2017-07-08 DIAGNOSIS — C211 Malignant neoplasm of anal canal: Secondary | ICD-10-CM

## 2017-07-08 MED ORDER — ESTRADIOL 1 MG PO TABS: 1.0000 mg | ORAL_TABLET | Freq: Every day | ORAL | 12 refills | Status: DC

## 2017-07-08 MED ORDER — PREDNISONE 5 MG PO TABS: ORAL_TABLET | ORAL | 3 refills | Status: DC

## 2017-07-08 MED ORDER — HYDROCORTISONE 2.5 % RE CREA: 1.0000 "application " | TOPICAL_CREAM | RECTAL | 6 refills | Status: DC | PRN

## 2017-07-08 MED ORDER — SUMATRIPTAN SUCCINATE 25 MG PO TABS: 25.0000 mg | ORAL_TABLET | ORAL | 6 refills | Status: DC | PRN

## 2017-07-08 NOTE — Progress Notes (Signed)
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 She was originally diagnosed with T2 N0 disease, and completed concurrent chemotherapy with radiation. She has done well and has been clinically without evidence of disease since she finished treatment in 2015. She's been followed in surveillance by radiation oncology, medical oncology, and GI. Dr. Watt Climes has been following [name] regularly with anoscopy, he saw her in December 2018 and she had a normal sigmoidoscopy. She was also seen last in October 2017 by Dr. Benay Spice. She comes today for follow up evaluation. She reports she saw OB/GYN in Angel summer of 2018 and that her examination was normal, and she had a normal mammogram in their office in May 2018. She is planning her next this May but will have to proceed with additional in another office if she's not seen for OB/GYN care.   On review of systems, [name] reports that she is doing well overall. She needs another prescription refill of her medication. Shecontinues to see urgent care providers as needed in [facility] where she resides Angel majority of Angel year. She is sexually active and reports she's not had to use her dilator often. She denies any chest pain, shortness of breath, cough, fevers, chills, night sweats, unintended weight changes. She reports no changes in her caliber of stools or rectal or vaginal bleeding. She denies any  bladder disturbances, and denies abdominal  pain, nausea or vomiting. She denies any new musculoskeletal or joint aches or pains, new skin lesions or concerns. A complete review of systems is obtained and is otherwise negative.    Past Medical History:  Past Medical History:  Diagnosis Date  . Allergic sinusitis   . Cancer ([place]) 01/21/13   rectum  . Headache(784.0)   . Hx of radiation therapy 02/21/13-04/04/13   rectal 50.4Gy/27fx  . Myocarditis, unspecified ([place])   . Pericardial effusion   . Pericarditis   . Pericarditis    history 2006 from virus  . Perimenopausal   . Plantar fasciitis of right foot   . Unspecified disease of pericardium     Past Surgical History:  Past Surgical History:  Procedure Laterality Date  . ABDOMINAL HYSTERECTOMY    . COLONOSCOPY    . ORIF CLAVICULAR FRACTURE  07/15/2011   Procedure: OPEN REDUCTION INTERNAL FIXATION (ORIF) CLAVICULAR FRACTURE;  Surgeon: Nita Sells, MD;  Location: [facility];  Service: Orthopedics;  Laterality: Right;  . OTHER SURGICAL HISTORY     post hysterectomy 1991  . pericardial window on November 11,2006, by Dr Cyndia Bent     Social History:  Social History   Social History Narrative   She is from [place] but currently lives in [place] with her husband. She is a retired Patent examiner. She denies any Tobacco, alcohol, or drug use. She does not routinely exercise. S She is interested however maintaining her health care for her cancer care here in  [place].    Family History:  Family History  Problem Relation Age of Onset  . Coronary artery disease Sister        stented at age 26  . Cancer Sister 3       melanoma  . Cancer Father        bladder  . Cancer Maternal Aunt 68       pancreas  . Cancer Paternal Aunt 90       breast cancer    Health Maintenance: [name] had colonoscopy in December 2017. She states that she has never had any polyps. Her last Pap smear was prior to her hysterectomy many years ago for  endometriosis. She's never had any abnormal Pap smears. She had a normal mammogram in May 2018.                    ALLERGIES:  is allergic to ciprofloxacin and penicillins.  Meds: Current Outpatient Medications  Medication Sig Dispense Refill  . Cholecalciferol 10000 UNITS CAPS Take 1,000 Units by mouth daily.    . Cyanocobalamin 1500 MCG TBDP Take 1,500 mcg by mouth daily.    Marland Kitchen estradiol (ESTRACE) 1 MG tablet Take 1 tablet (1 mg total) by mouth daily. 30 tablet 12  . hydrocortisone (PROCTOZONE-HC) 2.5 % rectal cream Place 1 application rectally as needed. 30 g 6  . metoprolol tartrate (LOPRESSOR) 25 MG tablet Take 0.5 tablets (12.5 mg total) by mouth every morning. 30 tablet 6  . Omega-3 Fatty Acids (FISH OIL BURP-LESS PO) Take 1 tablet by mouth daily. Reported on 06/27/2015    . predniSONE (DELTASONE) 5 MG tablet Take 5 mg daily prn for pericarditis flare up 30 tablet 3  . SUMAtriptan (IMITREX) 25 MG tablet Take 1 tablet (25 mg total) by mouth as needed for migraine. 10 tablet 6   No current facility-administered medications for this encounter.     Physical Findings:  vitals were not taken for this visit.   Pain scale 0/10  In general this is a well appearing caucasian female in no acute distress. She is alert and oriented x4 and appropriate throughout Angel examination. HEENT reveals that [name] is normocephalic, atraumatic. EOMs are intact. PERRLA. Skin is intact without any evidence of gross lesions. Cardiovascular exam reveals a regular rate and rhythm, no clicks rubs or murmurs are auscultated. Chest is clear to auscultation bilaterally. Lymphatic assessment is performed and does not reveal any adenopathy in Angel cervical, supraclavicular, axillary, or inguinal chains.  Abdomen has active bowel sounds in all quadrants and is intact. Angel abdomen is soft, non tender, non distended. Lower extremities are negative for pretibial pitting edema, deep calf tenderness, cyanosis or clubbing.  Pelvic exam reveals normal appearing external female genitalia.  No gross  lesions are noted of Angel labial, perineal or perianal tissue.  BUS is normal in appearance.  Speculum exam reveals a normal-appearing vaginal cuff without bleeding, lesions, or discharge. Bimanual examination reveals a smooth vaginal cuff with minimal scarring at Angel right apex without palpable masses.  No adnexal masses are noted. Angel banding of scar tissue is manually lysed without difficulty.  Rectovaginal exam reveals smooth septum, but is stenosed,  without induration or nodularity.  No parametrial thickening is identified.     Lab Findings: Lab Results  Component Value Date   WBC 3.0 (L) 10/05/2013   HGB 12.2 10/05/2013   HCT 37.3 10/05/2013   MCV 91.2 10/05/2013   PLT 197 10/05/2013   Radiographic  Findings: No results found. Impression/Plan:  1. 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 We reviewed Angel NCCN guidelines for surveillance of anal cancer and I would recommend she continue to see Dr. Watt Climes annually due to stenosis of Angel rectum. We will plan to see her back in 1 year's time in our clinic.  I will continue to provide GYN care for her as well. Although she does not qualify for pap smear screening per guidelines, she still should have annual pelvic exam. 2. Healthcare maintenance. [name] is going to have her mammogram in May at [facility], and next year we will order this. I've refilled her medications as well for estradiol, prednisone, and hydrocortisone cream.  3. Vaginal and rectal stenosis. [name] is given medical grade dilators to use for both vaginal and rectal sites. We will follow this expectantly and I've encouraged her to reach out to Dr. Watt Climes if she does not feel that Angel use of her dilators.        Carola Rhine, PAC

## 2017-10-01 ENCOUNTER — Other Ambulatory Visit: Payer: Self-pay | Admitting: Obstetrics and Gynecology

## 2017-10-01 DIAGNOSIS — Z1231 Encounter for screening mammogram for malignant neoplasm of breast: Secondary | ICD-10-CM

## 2017-11-07 ENCOUNTER — Other Ambulatory Visit: Payer: Self-pay | Admitting: Radiation Oncology

## 2017-11-12 ENCOUNTER — Ambulatory Visit
Admission: RE | Admit: 2017-11-12 | Discharge: 2017-11-12 | Disposition: A | Discharge status: Home / Self Care | Payer: Medicare Other | Source: Ambulatory Visit | Attending: Obstetrics and Gynecology | Admitting: Obstetrics and Gynecology

## 2017-11-12 DIAGNOSIS — Z1231 Encounter for screening mammogram for malignant neoplasm of breast: Secondary | ICD-10-CM

## 2017-12-30 ENCOUNTER — Encounter: Payer: Self-pay | Admitting: *Deleted

## 2017-12-30 ENCOUNTER — Telehealth: Payer: Self-pay | Admitting: *Deleted

## 2017-12-30 ENCOUNTER — Other Ambulatory Visit: Payer: Self-pay | Admitting: Radiation Oncology

## 2017-12-31 ENCOUNTER — Telehealth: Payer: Self-pay | Admitting: *Deleted

## 2017-12-31 ENCOUNTER — Telehealth: Payer: Self-pay | Admitting: Radiation Oncology

## 2017-12-31 ENCOUNTER — Encounter: Payer: Self-pay | Admitting: *Deleted

## 2017-12-31 NOTE — Telephone Encounter (Signed)
Pt wanted to know status of meds for anxiety. Transferred to Guadelupe Sabin, RN voicemail for return call.

## 2018-01-18 ENCOUNTER — Encounter: Payer: Self-pay | Admitting: Family Medicine

## 2018-06-16 ENCOUNTER — Telehealth: Payer: Self-pay | Admitting: *Deleted

## 2018-06-16 NOTE — Telephone Encounter (Signed)
CALLED PATIENT TO ALTER FU FOR 07-07-18 DUE TO COVOD 19, PATIENT'S APPT. RESCHEDULED FOR 08-16-18 @ 2:30 PM, PATIENT AGREED TO NEW TIME AND DATE

## 2018-07-07 ENCOUNTER — Ambulatory Visit: Payer: Self-pay | Admitting: Radiation Oncology

## 2018-08-03 ENCOUNTER — Telehealth: Payer: Self-pay | Admitting: *Deleted

## 2018-08-03 NOTE — Telephone Encounter (Signed)
CALLED PATIENT TO ALTER FU APPT. ON 08-16-18 PER PROVIDER REQUEST, PATIENT AGREED TO COME ON 10-25-18 @ 1 PM

## 2018-08-16 ENCOUNTER — Ambulatory Visit: Payer: Medicare Other | Admitting: Radiation Oncology

## 2018-10-25 ENCOUNTER — Encounter: Payer: Self-pay | Admitting: Radiation Oncology

## 2018-10-25 ENCOUNTER — Other Ambulatory Visit: Payer: Self-pay

## 2018-10-25 ENCOUNTER — Ambulatory Visit
Admission: RE | Admit: 2018-10-25 | Discharge: 2018-10-25 | Disposition: A | Discharge status: Home / Self Care | Payer: Medicare Other | Source: Ambulatory Visit | Attending: Radiation Oncology | Admitting: Radiation Oncology

## 2018-10-25 DIAGNOSIS — K624 Stenosis of anus and rectum: Secondary | ICD-10-CM

## 2018-10-25 DIAGNOSIS — C211 Malignant neoplasm of anal canal: Secondary | ICD-10-CM

## 2018-10-25 MED ORDER — SUMATRIPTAN SUCCINATE 25 MG PO TABS: 25.0000 mg | ORAL_TABLET | ORAL | 6 refills | Status: DC | PRN

## 2018-10-25 MED ORDER — PREDNISONE 5 MG PO TABS: ORAL_TABLET | ORAL | 3 refills | Status: DC

## 2018-10-25 MED ORDER — HYDROCORTISONE (PERIANAL) 2.5 % EX CREA: 1.0000 "application " | TOPICAL_CREAM | CUTANEOUS | 2 refills | Status: DC | PRN

## 2018-10-25 NOTE — Progress Notes (Addendum)
Radiation Oncology         (336) [phone] ________________________________  Outpatient Follow Up - Conducted via telephone due to current COVID-19 concerns for limiting patient exposure  I spoke with the patient to conduct this consult visit via telephone to spare the patient unnecessary potential exposure in the healthcare setting during the current COVID-19 pandemic. The patient was notified in advance and was offered a [facility] [place] meeting to allow for face to face communication but unfortunately reported that they did not have the appropriate resources/technology to support such a visit and instead preferred to proceed with a telephone visit.  ________________________________  Name: [name] MRN: [id number]  Date: 10/25/2018  DOB: Jul 17, 1950  Follow-Up Visit Note  CC: Mikey Kirschner, MD  Clarene Essex, MD  Diagnosis: Squamous cell carcinoma of the proximal anal canal/distal rectum         Interval Since Last Radiation: 5 years, 7 months  02/21/2013 - 04/04/2013: 50.4 Gy in 28 fractions to the anal canal and pelvic nodes  Narrative: [name] is a very pleasant 68 y.o. 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 She was originally diagnosed with T2 N0 disease, and completed concurrent chemotherapy with radiation. She has done well and has been clinically without evidence of disease since she finished treatment in 2015. She's been followed in surveillance by radiation oncology, medical oncology, and GI. Dr. Watt Climes has been following the patient regularly. She had been having anoscopy/sigmoidoscopy. In November 2019, she had colonoscopy which was clear, and she was encouraged to return in 2 years for repeat evaluation. She has been released by Dr. Benay Spice to prn visits. She reports she saw OB/GYN in the summer of 2018 and that her examination was normal, and she had a normal mammogram in their office in May 2018.   On review of systems, the patient reports  that she is doing well overall. She denies any chest pain, shortness of breath, cough, fevers, chills, night sweats, unintended weight changes. She denies any bowel or bladder disturbances, and denies abdominal pain, nausea or vomiting. She denies any new musculoskeletal or joint aches or pains, new skin lesions or concerns. She does have some pain at times following a bowel movement and reports intercourse is slightly more uncomfortable. She has not been using vaginal or rectal dilators recently but reports she probably needs to start using them again. A complete review of systems is obtained and is otherwise negative.   Past Medical History:  Past Medical History:  Diagnosis Date  . Allergic sinusitis   . Cancer ([place]) 01/21/13   rectum  . Headache(784.0)   . Hx of radiation therapy 02/21/13-04/04/13   rectal 50.4Gy/69fx  . Myocarditis, unspecified ([place])   . Pericardial effusion   . Pericarditis   . Pericarditis    history 2006 from virus  . Perimenopausal   . Plantar fasciitis of right foot   . Unspecified disease of pericardium     Past Surgical History:  Past Surgical History:  Procedure Laterality Date  . ABDOMINAL HYSTERECTOMY    . COLONOSCOPY    . ORIF CLAVICULAR FRACTURE  07/15/2011   Procedure: OPEN REDUCTION INTERNAL FIXATION (ORIF) CLAVICULAR FRACTURE;  Surgeon: Nita Sells, MD;  Location: [facility];  Service: Orthopedics;  Laterality: Right;  . OTHER SURGICAL HISTORY     post hysterectomy 1991  . pericardial window on November 11,2006, by Dr Cyndia Bent     Social History:  Social History  Social History Narrative   She is from [place] but permanently relocated to [facility] with her husband in about 2018. She is a retired Patent examiner. She denies any Tobacco, alcohol, or drug use. She does not routinely exercise.  She is interested in maintaining her health care for her cancer care here in [place].    Family History:  Family  History  Problem Relation Age of Onset  . Coronary artery disease Sister        stented at age 67  . Cancer Sister 46       melanoma  . Cancer Father        bladder  . Cancer Maternal Aunt 68       pancreas  . Cancer Paternal Aunt 24       breast cancer    Health Maintenance: The patient had colonoscopy in November 2019. She states that she has never had any polyps. Her last Pap smear was prior to her hysterectomy many years ago for endometriosis. She's never had any abnormal Pap smears. She had a normal mammogram in August 2019 at the [facility] in [place].                    ALLERGIES:  is allergic to ciprofloxacin; fluconazole; detrol  [tolterodine tartrate]; and penicillins.  Meds: Current Outpatient Medications  Medication Sig Dispense Refill  . Cholecalciferol 10000 UNITS CAPS Take 1,000 Units by mouth daily.    . Cyanocobalamin 1500 MCG TBDP Take 1,500 mcg by mouth daily.    Marland Kitchen estradiol (ESTRACE) 1 MG tablet Take 1 tablet (1 mg total) by mouth daily. 30 tablet 12  . estradiol (ESTRACE) 1 MG tablet TAKE 1 TABLET(1 MG) BY MOUTH DAILY 90 tablet 12  . SUMAtriptan (IMITREX) 25 MG tablet Take 1 tablet (25 mg total) by mouth as needed for migraine. 10 tablet 6  . Vitamin E 500 UNIT/GM POWD vitamin E    . hydrocortisone (PROCTOZONE-HC) 2.5 % rectal cream Place 1 application rectally as needed for hemorrhoids or anal itching. 30 g 2  . Omega-3 Fatty Acids (FISH OIL BURP-LESS PO) Take 1 tablet by mouth daily. Reported on 06/27/2015    . predniSONE (DELTASONE) 5 MG tablet Take 5 mg daily prn for pericarditis flare up 30 tablet 3   No current facility-administered medications for this encounter.     Physical Findings:  vitals were not taken for this visit.   Pain scale 0/10  Unable to assess.     Lab Findings: Lab Results  Component Value Date   WBC 3.0 (L) 10/05/2013   HGB 12.2 10/05/2013   HCT 37.3 10/05/2013   MCV 91.2 10/05/2013   PLT 197 10/05/2013    Radiographic Findings: No results found.   Impression/Plan:  1. Stage II (T2,N0,M0) squamous cell carcinoma of the proximal anal canal/distal rectum.The patient appears to be clinically without evidence of disease. We reviewed the NCCN guidelines for surveillance of anal cancer and I would recommend she continue to see Dr. Watt Climes  due to stenosis of the rectum. We will plan to see her back in the early spring of 2021 in our clinic or sooner if she becomes symptomatic.  I will continue to provide GYN care for her as well. Although she does not qualify for pap smear screening per guidelines, she still should have annual pelvic exam. 2. Healthcare maintenance. The patient is going to continue her mammography in [place] and will return in September for this to  [facility]. I've refilled her medications as well for estradiol, prednisone, and hydrocortisone cream. She is also planning to purchase a home blood pressure monitor and notify us of any abnormal readings. She ideally needs annual cholesterol screening as well and we will discuss if she's interested in relocating care for these types of screening closer to home.  3. Vaginal and rectal stenosis. The patient will restart using both her dilators and prn ibuprofen following the use of these. She is to call if she is having any difficulty sooner than her next visit.  Given current concerns for patient exposure during the COVID-19 pandemic, this encounter was conducted via telephone.  The patient has given verbal consent for this type of encounter. The time spent during this encounter was 25 minutes and 50% of that time was spent in the coordination of her care. The attendants for this meeting include Shona Simpson, [facility] and [place] [facility]  During the encounter, Shona Simpson [facility] was located at [facility] Radiation Oncology Department.  Willisha P Fluegge  was located at home.    Carola Rhine, PAC

## 2018-12-21 ENCOUNTER — Other Ambulatory Visit: Payer: Self-pay | Admitting: Obstetrics and Gynecology

## 2018-12-21 DIAGNOSIS — Z1231 Encounter for screening mammogram for malignant neoplasm of breast: Secondary | ICD-10-CM

## 2019-01-31 ENCOUNTER — Other Ambulatory Visit: Payer: Self-pay

## 2019-01-31 ENCOUNTER — Ambulatory Visit
Admission: RE | Admit: 2019-01-31 | Discharge: 2019-01-31 | Disposition: A | Discharge status: Home / Self Care | Payer: Medicare Other | Source: Ambulatory Visit | Attending: Obstetrics and Gynecology | Admitting: Obstetrics and Gynecology

## 2019-01-31 DIAGNOSIS — Z1231 Encounter for screening mammogram for malignant neoplasm of breast: Secondary | ICD-10-CM

## 2019-04-04 ENCOUNTER — Other Ambulatory Visit: Payer: Self-pay

## 2019-04-04 DIAGNOSIS — C211 Malignant neoplasm of anal canal: Secondary | ICD-10-CM

## 2019-04-04 MED ORDER — HYDROCORTISONE (PERIANAL) 2.5 % EX CREA: 1.0000 "application " | TOPICAL_CREAM | CUTANEOUS | 2 refills | Status: DC | PRN

## 2019-04-20 ENCOUNTER — Telehealth: Payer: Self-pay

## 2019-04-20 NOTE — Telephone Encounter (Signed)
Virtual Visit Pre-Appointment Phone Call  "(Name), I am calling you today to discuss your upcoming appointment. We are currently trying to limit exposure to the virus that causes COVID-19 by seeing patients at home rather than in the office."  1. "What is the BEST phone number to call the day of the visit?" - include this in appointment notes  2. "Do you have or have access to (through a family member/friend) a smartphone with video capability that we can use for your visit?" a. If yes - list this number in appt notes as "cell" (if different from BEST phone #) and list the appointment type as a VIDEO visit in appointment notes b. If no - list the appointment type as a PHONE visit in appointment notes  Confirm consent - "In the setting of the current Covid19 crisis, you are scheduled for a (phone or video) visit with your provider on (date) at (time).  Just as we do with many in-office visits, in order for you to participate in this visit, we must obtain consent.  If you'd like, I can send this to your mychart (if signed up) or email for you to review.  Otherwise, I can obtain your verbal consent now.  All virtual visits are billed to your insurance company just like a normal visit would be.  By agreeing to a virtual visit, we'd like you to understand that the technology does not allow for your provider to perform an examination, and thus may limit your provider's ability to fully assess your condition. If your provider identifies any concerns that need to be evaluated in person, we will make arrangements to do so.  Finally, though the technology is pretty good, we cannot assure that it will always work on either your or our end, and in the setting of a video visit, we may have to convert it to a phone-only visit.  In either situation, we cannot ensure that we have a secure connection.  Are you willing to proceed?" STAFF: Did the patient verbally acknowledge consent to telehealth visit? Document  YES/NO here:  3. Advise patient to be prepared - "Two hours prior to your appointment, go ahead and check your blood pressure, pulse, oxygen saturation, and your weight (if you have the equipment to check those) and write them all down. When your visit starts, your provider will ask you for this information. If you have an Apple Watch or Kardia device, please plan to have heart rate information ready on the day of your appointment. Please have a pen and paper handy nearby the day of the visit as well."  4. Give patient instructions for MyChart download to smartphone OR Doximity/Doxy.me as below if video visit (depending on what platform provider is using)  5. Inform patient they will receive a phone call 15 minutes prior to their appointment time (may be from unknown caller ID) so they should be prepared to answer    [name] has been deemed a candidate for a follow-up tele-health visit to limit community exposure during the Covid-19 pandemic. I spoke with the patient via phone to ensure availability of phone/video source, confirm preferred email & phone number, and discuss instructions and expectations.  I reminded [name] to be prepared with any vital sign and/or heart rhythm information that could potentially be obtained via home monitoring, at the time of her visit. I reminded [name] to expect a phone call prior to her visit.  [name] 04/20/2019 2:03 PM   INSTRUCTIONS FOR DOWNLOADING THE MYCHART APP TO SMARTPHONE  - The patient must first make sure to have activated MyChart and know their login information - If Apple, go to [employer] and type in MyChart in the search bar and download the app. If Android, ask patient to go to [employer] and type in [place] in the search bar and download the app. The app is free but as with any other app downloads, their phone may require them to verify saved payment information or Apple/Android password.    - The patient will need to then log into the app with their MyChart username and password, and select [facility] as their healthcare provider to link the account. When it is time for your visit, go to the MyChart app, find appointments, and click Begin Video Visit. Be sure to Select Allow for your device to access the Microphone and Camera for your visit. You will then be connected, and your provider will be with you shortly.  **If they have any issues connecting, or need assistance please contact MyChart service desk [phone])**  **If using a computer, in order to ensure the best quality for their visit they will need to use either of the following Internet Browsers: [employer], or Google Chrome**  IF USING DOXIMITY or DOXY.ME - The patient will receive a link just prior to their visit by text.     FULL LENGTH CONSENT FOR TELE-HEALTH VISIT   I hereby voluntarily request, consent and authorize [facility] and its employed or contracted physicians, physician assistants, nurse practitioners or other licensed health care professionals (the Practitioner), to provide me with telemedicine health care services (the "Services") as deemed necessary by the treating Practitioner. I acknowledge and consent to receive the Services by the Practitioner via telemedicine. I understand that the telemedicine visit will involve communicating with the Practitioner through live audiovisual communication technology and the disclosure of certain medical information by electronic transmission. I acknowledge that I have been given the opportunity to request an in-person assessment or other available alternative prior to the telemedicine visit and am voluntarily participating in the telemedicine visit.  I understand that I have the right to withhold or withdraw my consent to the use of telemedicine in the course of my care at any time, without affecting my right to future care or treatment, and that  the Practitioner or I may terminate the telemedicine visit at any time. I understand that I have the right to inspect all information obtained and/or recorded in the course of the telemedicine visit and may receive copies of available information for a reasonable fee.  I understand that some of the potential risks of receiving the Services via telemedicine include:  Marland Kitchen Delay or interruption in medical evaluation due to technological equipment failure or disruption; . Information transmitted may not be sufficient (e.g. poor resolution of images) to allow for appropriate medical decision making by the Practitioner; and/or  . In rare instances, security protocols could fail, causing a breach of personal health information.  Furthermore, I acknowledge that it is my responsibility to provide information about my medical history, conditions and care that is complete and accurate to the best of my ability. I acknowledge that Practitioner's advice, recommendations, and/or decision may be based on factors not within their control, such as incomplete or inaccurate data provided by me or distortions of diagnostic images or specimens that may result from electronic transmissions. I understand that the  practice of medicine is not an Chief Strategy Officer and that Practitioner makes no warranties or guarantees regarding treatment outcomes. I acknowledge that I will receive a copy of this consent concurrently upon execution via email to the email address I last provided but may also request a printed copy by calling the office of [facility].    I understand that my insurance will be billed for this visit.   I have read or had this consent read to me. . I understand the contents of this consent, which adequately explains the benefits and risks of the Services being provided via telemedicine.  . I have been provided ample opportunity to ask questions regarding this consent and the Services and have had my questions answered to  my satisfaction. . I give my informed consent for the services to be provided through the use of telemedicine in my medical care  By participating in this telemedicine visit I agree to the above.

## 2019-04-21 ENCOUNTER — Encounter: Payer: Self-pay | Admitting: Family Medicine

## 2019-04-27 ENCOUNTER — Encounter: Payer: Self-pay | Admitting: Cardiovascular Disease

## 2019-04-27 ENCOUNTER — Telehealth (INDEPENDENT_AMBULATORY_CARE_PROVIDER_SITE_OTHER): Payer: Self-pay | Admitting: Cardiovascular Disease

## 2019-04-27 VITALS — Ht 63.0 in | Wt 127.0 lb

## 2019-04-27 DIAGNOSIS — I319 Disease of pericardium, unspecified: Secondary | ICD-10-CM

## 2019-04-27 DIAGNOSIS — R600 Localized edema: Secondary | ICD-10-CM

## 2019-04-27 MED ORDER — HYDROCHLOROTHIAZIDE 12.5 MG PO CAPS: 12.5000 mg | ORAL_CAPSULE | Freq: Every day | ORAL | 3 refills | Status: DC

## 2019-04-27 MED ORDER — HYDROCHLOROTHIAZIDE 12.5 MG PO CAPS: 12.5000 mg | ORAL_CAPSULE | Freq: Every day | ORAL | 3 refills | Status: DC | PRN

## 2019-04-27 NOTE — Progress Notes (Signed)
Virtual Visit via Telephone Note   This visit type was conducted due to national recommendations for restrictions regarding the COVID-19 Pandemic (e.g. social distancing) in an effort to limit this patient's exposure and mitigate transmission in our community.  Due to her co-morbid illnesses, this patient is at least at moderate risk for complications without adequate follow up.  This format is felt to be most appropriate for this patient at this time.  The patient did not have access to video technology/had technical difficulties with video requiring transitioning to audio format only (telephone).  All issues noted in this document were discussed and addressed.  No physical exam could be performed with this format.  Please refer to the patient's chart for her  consent to telehealth for [facility].   Date:  04/27/2019   ID:  [name], DOB December 11, 1950, MRN [id number]  Patient Location: Home Provider Location: Home  PCP:  Mikey Kirschner, MD  Cardiologist:  No primary care provider on file.  Electrophysiologist:  None   Evaluation Performed:  New Patient Evaluation  Chief Complaint:  Follow up  History of Present Illness:    [name] is a 69 y.o. female with a history of pericarditis and pericardial window in November 2006.  I have not evaluated her since May 2017.  Cardiac MRI in March 2017 revealed no pericardial effusion or signs of pericarditis, normal left ventricular size and function, EF 72%, no signs of myocarditis, and normal cardiac chambers and valves.  In March 2018 she had an episode of chest discomfort. It got worse in April. She tried prednisone but thinks it may have been too low a dose. It radiated to her jaw and left arm. She was taken to the ED.  She underwent cardiac catheterization and there were reportedly no blockages.  She's had no recurrences since then.  She and her husband have been living in the [facility] for the past 4  years.  She has prednisone in case it recurs.  She does have occasional bilateral leg swelling. She had questions about using HCTZ which she has used in the past.  SBP runs in the 120 range.  She said that the doctors are very busy in the [facility] area and she would like to continue to follow-up with me virtually.   Past Medical History:  Diagnosis Date  . Allergic sinusitis   . Cancer ([place]) 01/21/13   rectum  . Headache(784.0)   . Hx of radiation therapy 02/21/13-04/04/13   rectal 50.4Gy/86fx  . Myocarditis, unspecified ([place])   . Pericardial effusion   . Pericarditis   . Pericarditis    history 2006 from virus  . Perimenopausal   . Plantar fasciitis of right foot   . Unspecified disease of pericardium    Past Surgical History:  Procedure Laterality Date  . ABDOMINAL HYSTERECTOMY    . COLONOSCOPY    . ORIF CLAVICULAR FRACTURE  07/15/2011   Procedure: OPEN REDUCTION INTERNAL FIXATION (ORIF) CLAVICULAR FRACTURE;  Surgeon: Nita Sells, MD;  Location: [facility];  Service: Orthopedics;  Laterality: Right;  . OTHER SURGICAL HISTORY     post hysterectomy 1991  . pericardial window on November 11,2006, by Dr Cyndia Bent       Current Meds  Medication Sig  . Ascorbic Acid (VITAMIN C) 100 MG tablet Take 100 mg by mouth daily.  . Cholecalciferol 10000 UNITS CAPS Take 1,000 Units by mouth daily.  . Cyanocobalamin 1500 MCG  TBDP Take 1,500 mcg by mouth daily.  Marland Kitchen estradiol (ESTRACE) 1 MG tablet Take 1 tablet (1 mg total) by mouth daily.  . hydrocortisone (PROCTOZONE-HC) 2.5 % rectal cream Place 1 application rectally as needed for hemorrhoids or anal itching.  . Vitamin E 500 UNIT/GM POWD vitamin E  . [DISCONTINUED] predniSONE (DELTASONE) 5 MG tablet Take 5 mg daily prn for pericarditis flare up     Allergies:   Ciprofloxacin, Fluconazole, Detrol  [tolterodine tartrate], and Penicillins   Social History   Tobacco Use  . Smoking status: Never Smoker   . Smokeless tobacco: Never Used  Substance Use Topics  . Alcohol use: Yes    Comment: occasional  . Drug use: No     Family Hx: The patient's family history includes Cancer in her father; Cancer (age of onset: 52) in her sister; Cancer (age of onset: 46) in her maternal aunt; Cancer (age of onset: 82) in her paternal aunt; Coronary artery disease in her sister.  ROS:   Please see the history of present illness.     All other systems reviewed and are negative.   Prior CV studies:   The following studies were reviewed today:  Reviewed above  Labs/Other Tests and Data Reviewed:    EKG:  No ECG reviewed.  Recent Labs: No results found for requested labs within last 8760 hours.   Recent Lipid Panel No results found for: CHOL, TRIG, HDL, CHOLHDL, LDLCALC, LDLDIRECT  Wt Readings from Last 3 Encounters:  04/27/19 127 lb (57.6 kg)  07/08/17 126 lb 8 oz (57.4 kg)  07/01/16 125 lb 3.2 oz (56.8 kg)     Objective:    Vital Signs:  Ht 5\' 3"  (1.6 m)   Wt 127 lb (57.6 kg)   BMI 22.50 kg/m    VITAL SIGNS:  reviewed  ASSESSMENT & PLAN:    1. Pericarditis:  Underwent pericardial window in November 2006. No recurrences since 2018. Reportedly underwent a normal cardiac catheterization in 2018. I talked to her about prednisone leading to recurrences and that the primary treatment would be higher doses of ibuprofen along with colchicine.  2. Bilateral leg edema: Will prescribe HCTZ 12.5 mg prn. I instructed her to let me know if she has to use it frequently, as it would be important to also use potassium chloride along with it.  COVID-19 Education: The signs and symptoms of COVID-19 were discussed with the patient and how to seek care for testing (follow up with PCP or arrange E-visit).  The importance of social distancing was discussed today.  Time:   Today, I have spent 20 minutes with the patient with telehealth technology discussing the above problems.     Medication  Adjustments/Labs and Tests Ordered: Current medicines are reviewed at length with the patient today.  Concerns regarding medicines are outlined above.   Tests Ordered: No orders of the defined types were placed in this encounter.   Medication Changes: No orders of the defined types were placed in this encounter.   Follow Up:  Virtual Visit  in 6 month(s)  Signed, Kate Sable, MD  04/27/2019 9:07 AM    [place]

## 2019-04-27 NOTE — Addendum Note (Signed)
Addended by: Debbora Lacrosse R on: 04/27/2019 09:42 AM   Modules accepted: Orders

## 2019-04-27 NOTE — Patient Instructions (Signed)
Medication Instructions:  Start HCTZ 12.5 MG- DAILY AS NEEDED FOR LEG SWELLING   Labwork: NONE  Testing/Procedures: NONE  Follow-Up: Your physician wants you to follow-up in: 6 MONTHS.  You will receive a reminder letter in the mail two months in advance. If you don't receive a letter, please call our office to schedule the follow-up appointment.   Any Other Special Instructions Will Be Listed Below (If Applicable).     If you need a refill on your cardiac medications before your next appointment, please call your pharmacy.

## 2019-04-29 ENCOUNTER — Telehealth: Payer: Self-pay | Admitting: Licensed Clinical Social Worker

## 2019-04-29 NOTE — Telephone Encounter (Signed)
CSW referred to assist patient with obtaining a BP cuff. CSW contacted patient to inform cuff will be delivered to home. Patient grateful for support and assistance. CSW available as needed. Jackie Chaos Carlile, LCSW, CCSW-MCS [phone]  

## 2019-06-07 ENCOUNTER — Telehealth: Payer: Self-pay | Admitting: *Deleted

## 2019-06-07 NOTE — Telephone Encounter (Addendum)
Left a voicemail for the patient to let her know that we received her medication refill request.  I let her know that I spoke with PA Dara Lords and she felt that her cardiologist or PCP should be the one to manage this medication as she has not been seen in our practice for some time. I left my call back number [phone]) in case she has questions.  Will continue to follow as necessary.  Gloriajean Dell. Leonie Green, BSN

## 2019-11-02 ENCOUNTER — Telehealth: Payer: Self-pay | Admitting: Cardiovascular Disease

## 2019-11-14 ENCOUNTER — Telehealth: Payer: Self-pay

## 2019-11-14 NOTE — Telephone Encounter (Signed)
Spoke with patient in regards to coming in person for a follow-up visit with Shona Simpson PA . Patient has some questions and also wanted to speak about last coloscopy that was done. Per Theressa Millard ok to schedule patient on 12/13/19 at 2:30pm. Patient verbalized understanding of appointment date and time. TM

## 2019-12-13 ENCOUNTER — Ambulatory Visit
Admission: RE | Admit: 2019-12-13 | Discharge: 2019-12-13 | Disposition: A | Discharge status: Home / Self Care | Payer: Medicare PPO | Source: Ambulatory Visit | Attending: Radiation Oncology | Admitting: Radiation Oncology

## 2019-12-13 ENCOUNTER — Other Ambulatory Visit: Payer: Self-pay

## 2019-12-13 ENCOUNTER — Encounter: Payer: Self-pay | Admitting: Radiation Oncology

## 2019-12-13 VITALS — BP 134/68 | HR 74 | Temp 98.2°F | Resp 18 | Ht 63.0 in | Wt 130.4 lb

## 2019-12-13 DIAGNOSIS — C211 Malignant neoplasm of anal canal: Secondary | ICD-10-CM

## 2019-12-13 DIAGNOSIS — N952 Postmenopausal atrophic vaginitis: Secondary | ICD-10-CM

## 2019-12-13 NOTE — Progress Notes (Signed)
Radiation Oncology         [phone] ________________________________   Name: ANGELYN OSTERBERG MRN: [id number]  Date: 12/13/2019  DOB: [birth year]  Follow-Up Visit Note  CC: Mikey Kirschner, MD (Inactive)  No ref. 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 Pippenger is a very pleasant 69 y.o. 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 She was originally diagnosed with T2 N0 disease, and completed concurrent chemotherapy with radiation. She has done well and has been clinically without evidence of disease since she finished treatment in 2015. She's been followed in surveillance by radiation oncology, medical oncology, and GI. Dr. Watt Climes has been following the patient regularly. She is due this year for evaluation with Dr. Daisey Must with colonoscopy. She has been released by Dr. Benay Spice to prn visits. She reports she saw OB/GYN in the summer of 2018 and that her examination was normal, and she had a normal mammogram in their office in May 2018. She has since been followed at the [facility] in [place] for mammography, and her last was normal in November 2020. She's seen today for her annual visit which she prefers to keep in our department. Of note she has had a small bowel obstruction in 2019, and was hospitalized and had bowel rest which resolved her symptoms. She's had a few intermittent episodes of abdominal pain and nausea that has resolved with bowel rest. Her symptoms were associated with eating raw vegetables.  On review of systems, the patient reports that she is doing well overall. She reports that she is not having any abdominal pain or symptoms of obstruction at this time. She does continue to have dyspareunia and difficulty with tightening of the  vaginal introitus. She has not been taking her estrogen regularly, and is no longer using topical estrogen cream. She has been trying to find time to use her vaginal dilator.  She denies any chest pain, shortness of breath, cough, fevers, chills, night sweats, unintended weight changes. She denies any bowel or bladder disturbances, and denies. denies any new musculoskeletal or joint aches or pains, new skin lesions or concerns. A complete review of systems is obtained and is otherwise negative.    Past Medical History:  Past Medical History:  Diagnosis Date  . Allergic sinusitis   . Cancer ([place]) 01/21/13   rectum  . Headache(784.0)   . Hx of radiation therapy 02/21/13-04/04/13   rectal 50.4Gy/37fx  . Myocarditis, unspecified ([place])   . Pericardial effusion   . Pericarditis   . Pericarditis    history 2006 from virus  . Perimenopausal   . Plantar fasciitis of right foot   . Unspecified disease of pericardium     Past Surgical History:  Past Surgical History:  Procedure Laterality Date  . ABDOMINAL HYSTERECTOMY    . COLONOSCOPY    . ORIF CLAVICULAR FRACTURE  07/15/2011   Procedure: OPEN REDUCTION INTERNAL FIXATION (ORIF) CLAVICULAR FRACTURE;  Surgeon: Nita Sells, MD;  Location: [facility];  Service: Orthopedics;  Laterality: Right;  . OTHER SURGICAL HISTORY     post hysterectomy 1991  . pericardial window on November 11,2006, by Dr Cyndia Bent     Social History:  Social History   Social History  Narrative   She is from [place] but permanently relocated to [facility] with her husband in about 2018. She is a retired [employer] examiner. She denies any Tobacco, alcohol, or drug use. She does not routinely exercise.  She is interested in maintaining her health care for her cancer care here in [address].    Family History:  Family History  Problem Relation Age of Onset  . Coronary artery disease Sister        stented at age 79  . Cancer Sister 76        melanoma  . Cancer Father        bladder  . Cancer Maternal Aunt 68       pancreas  . Cancer Paternal Aunt 46       breast cancer    Health Maintenance: The patient had colonoscopy in November 2019. She states that she has never had any polyps. Her last Pap smear was prior to her hysterectomy many years ago for endometriosis. She's never had any abnormal Pap smears. She had a normal mammogram in November 2020 at the [facility] in [place].                    ALLERGIES:  is allergic to ciprofloxacin, fluconazole, detrol  [tolterodine tartrate], and penicillins.  Meds: Current Outpatient Medications  Medication Sig Dispense Refill  . Ascorbic Acid (VITAMIN C) 100 MG tablet Take 100 mg by mouth daily.    . Cholecalciferol 10000 UNITS CAPS Take 1,000 Units by mouth daily.    . Cyanocobalamin 1500 MCG TBDP Take 1,500 mcg by mouth daily.    Marland Kitchen estradiol (ESTRACE) 1 MG tablet Take 1 tablet (1 mg total) by mouth daily. 30 tablet 12  . hydrochlorothiazide (MICROZIDE) 12.5 MG capsule Take 1 capsule (12.5 mg total) by mouth daily as needed. 90 capsule 3  . hydrocortisone (PROCTOZONE-HC) 2.5 % rectal cream Place 1 application rectally as needed for hemorrhoids or anal itching. 30 g 2  . Vitamin E 500 UNIT/GM POWD vitamin E     No current facility-administered medications for this encounter.    Physical Findings: Wt Readings from Last 3 Encounters:  04/27/19 127 lb (57.6 kg)  07/08/17 126 lb 8 oz (57.4 kg)  07/01/16 125 lb 3.2 oz (56.8 kg)   Temp Readings from Last 3 Encounters:  07/08/17 98.2 F (36.8 C) (Oral)  07/01/16 97.8 F (36.6 C) (Oral)  12/24/15 98 F (36.7 C) (Oral)   BP Readings from Last 3 Encounters:  07/08/17 131/69  07/01/16 137/75  12/24/15 126/67   Pulse Readings from Last 3 Encounters:  07/08/17 77  07/01/16 68  12/24/15 76   In general this is a well appearing caucasian female in no acute distress. She is alert and oriented x4 and appropriate throughout  the examination. HEENT reveals that the patient is normocephalic, atraumatic. EOMs are intact. PERRLA. Skin is intact without any evidence of gross lesions. Cardiovascular exam reveals a regular rate and rhythm, no clicks rubs or murmurs are auscultated. Chest is clear to auscultation bilaterally. Lymphatic assessment is performed and does not reveal any adenopathy in the cervical, supraclavicular, axillary, or inguinal chains. Abdomen has active bowel sounds in all quadrants and is intact. The abdomen is soft, non tender, non distended. Lower extremities are negative for pretibial pitting edema, deep calf tenderness, cyanosis or clubbing. Bilateral breast exam does not reveal any palpable masses, skin changes or nipple abnormalities.  Pelvic exam reveals normal-appearing external female  genitalia, labial perineal and perianal tissue.  No lesions are seen grossly.  BUS is normal in appearance.  Speculum exam reveals a well-healed vaginal cuff without bleeding lesions or discharge.  Bimanual exam reveals a well-healed vaginal cuff with palpable scarring at the right vaginal fornix extending to the left. Without difficulty, this was manually lysed. The patient did have mild spotting following. No adnexal fullness or pelvic mass can be appreciated.  Rectovaginal exam reveals stenosis, without palpable mass or induration.     Lab Findings: Lab Results  Component Value Date   WBC 3.0 (L) 10/05/2013   HGB 12.2 10/05/2013   HCT 37.3 10/05/2013   MCV 91.2 10/05/2013   PLT 197 10/05/2013   Radiographic Findings: No results found.   Impression/Plan:  1. 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 Clinically the patient appears to be NED. We reviewed the rationale to continue with her plans to see Dr. Watt Climes this year for colonoscopy. Although she does not qualify for pap smear screening per guidelines, she still should have annual pelvic exam and will return in 1  years' time. 2. Healthcare maintenance. The patient is going to continue her mammography in [place] and will return in November 2021 for this. She will return in one year for GYN care. In the meantime we will try to help locate a PCP for her as she needs other screening  3. Vaginal and rectal stenosis. The patient will restart using both her dilators and see Dr. Watt Climes soon. I suspect she will need dilation of her anorectal sphincter based on exam today. We reviewed resuming estrogen orally if she would like, and locally using vagifem to try to alleviate some of her dyspareunia. She will let me know if she needs to be seen sooner for evaluation of this prior to next year's visit.  In a visit lasting 60 minutes, greater than 50% of the time was spent face to face discussing the patient's condition, in preparation for the discussion, and coordinating the patient's care.     Carola Rhine, PAC

## 2019-12-14 ENCOUNTER — Other Ambulatory Visit: Payer: Self-pay | Admitting: Radiation Oncology

## 2019-12-14 DIAGNOSIS — N952 Postmenopausal atrophic vaginitis: Secondary | ICD-10-CM | POA: Insufficient documentation

## 2019-12-14 MED ORDER — ESTRADIOL 10 MCG VA TABS: ORAL_TABLET | VAGINAL | 0 refills | Status: DC

## 2019-12-14 MED ORDER — ESTRADIOL 10 MCG VA TABS: ORAL_TABLET | VAGINAL | 12 refills | Status: DC

## 2019-12-22 ENCOUNTER — Other Ambulatory Visit: Payer: Self-pay | Admitting: Radiation Oncology

## 2020-01-17 ENCOUNTER — Telehealth: Payer: Self-pay | Admitting: Radiation Oncology

## 2020-01-17 NOTE — Telephone Encounter (Signed)
I called and spoke with the patient to give her contact information for a pcp office being recommended by her prior pcp I contacted, [facility] in [facility]. She will give them a call to try to get established and let me know if she needs a referral.    Carola Rhine, PAC

## 2020-02-07 ENCOUNTER — Other Ambulatory Visit: Payer: Self-pay | Admitting: Radiation Oncology

## 2020-02-07 DIAGNOSIS — Z1231 Encounter for screening mammogram for malignant neoplasm of breast: Secondary | ICD-10-CM

## 2020-03-23 ENCOUNTER — Ambulatory Visit: Payer: Medicare PPO

## 2020-05-03 ENCOUNTER — Other Ambulatory Visit: Payer: Self-pay

## 2020-05-03 ENCOUNTER — Other Ambulatory Visit: Payer: Self-pay | Admitting: Student

## 2020-05-03 MED ORDER — HYDROCHLOROTHIAZIDE 12.5 MG PO TABS
12.5000 mg | ORAL_TABLET | Freq: Every day | ORAL | 0 refills | Status: DC
Start: 2020-05-03 — End: 2020-08-06

## 2020-05-03 NOTE — Telephone Encounter (Signed)
Per B.Strader, PA-C, patient may have 90 day supply of HCTZ but must make an apt and have lab work Artist) Administrator, sports will attempt to reach patient.

## 2020-05-07 ENCOUNTER — Inpatient Hospital Stay: Admission: RE | Admit: 2020-05-07 | Payer: Medicare PPO | Source: Ambulatory Visit

## 2020-05-09 ENCOUNTER — Other Ambulatory Visit: Payer: Self-pay | Admitting: Student

## 2020-05-09 MED ORDER — HYDROCHLOROTHIAZIDE 12.5 MG PO CAPS: 12.5000 mg | ORAL_CAPSULE | Freq: Every day | ORAL | 0 refills | Status: AC | PRN

## 2020-06-22 ENCOUNTER — Ambulatory Visit
Admission: RE | Admit: 2020-06-22 | Discharge: 2020-06-22 | Disposition: A | Discharge status: Home / Self Care | Payer: Medicare PPO | Source: Ambulatory Visit | Attending: Radiation Oncology | Admitting: Radiation Oncology

## 2020-06-22 ENCOUNTER — Other Ambulatory Visit: Payer: Self-pay

## 2020-06-22 DIAGNOSIS — Z1231 Encounter for screening mammogram for malignant neoplasm of breast: Secondary | ICD-10-CM

## 2020-06-26 ENCOUNTER — Other Ambulatory Visit: Payer: Self-pay | Admitting: Radiation Oncology

## 2020-06-26 DIAGNOSIS — R928 Other abnormal and inconclusive findings on diagnostic imaging of breast: Secondary | ICD-10-CM

## 2020-07-20 ENCOUNTER — Other Ambulatory Visit: Payer: Self-pay

## 2020-07-20 ENCOUNTER — Ambulatory Visit
Admission: RE | Admit: 2020-07-20 | Discharge: 2020-07-20 | Disposition: A | Discharge status: Home / Self Care | Payer: Medicare PPO | Source: Ambulatory Visit | Attending: Radiation Oncology | Admitting: Radiation Oncology

## 2020-07-20 DIAGNOSIS — R928 Other abnormal and inconclusive findings on diagnostic imaging of breast: Secondary | ICD-10-CM

## 2020-08-06 ENCOUNTER — Other Ambulatory Visit: Payer: Self-pay | Admitting: Student

## 2020-08-06 NOTE — Telephone Encounter (Signed)
This is a [place] pt.  °

## 2020-10-02 ENCOUNTER — Other Ambulatory Visit: Payer: Self-pay | Admitting: Gastroenterology

## 2020-10-02 DIAGNOSIS — K56609 Unspecified intestinal obstruction, unspecified as to partial versus complete obstruction: Secondary | ICD-10-CM

## 2020-10-09 ENCOUNTER — Other Ambulatory Visit: Payer: Self-pay | Admitting: Radiation Oncology

## 2020-10-09 DIAGNOSIS — C211 Malignant neoplasm of anal canal: Secondary | ICD-10-CM

## 2020-10-09 MED ORDER — HYDROCORTISONE (PERIANAL) 2.5 % EX CREA: 1.0000 "application " | TOPICAL_CREAM | CUTANEOUS | 2 refills | Status: AC | PRN

## 2020-10-09 MED ORDER — ESTRADIOL 10 MCG VA TABS: ORAL_TABLET | VAGINAL | 12 refills | Status: AC

## 2020-10-09 MED ORDER — ESTRADIOL 1 MG PO TABS: 1.0000 mg | ORAL_TABLET | Freq: Every day | ORAL | 12 refills | Status: AC

## 2020-10-09 MED ORDER — PREDNISONE 5 MG PO TABS: 5.0000 mg | ORAL_TABLET | Freq: Every day | ORAL | 0 refills | Status: AC | PRN

## 2020-11-06 ENCOUNTER — Other Ambulatory Visit: Payer: Medicare PPO

## 2021-07-29 ENCOUNTER — Other Ambulatory Visit: Payer: Self-pay
